# Patient Record
Sex: Female | Born: 1970 | Race: White | Hispanic: No | Smoking: Former smoker
Health system: Southern US, Community
[De-identification: ages and names within clinical notes are randomized; demographics above are authoritative.]

## PROBLEM LIST (undated history)

## (undated) ENCOUNTER — Ambulatory Visit

## (undated) ENCOUNTER — Encounter

## (undated) DIAGNOSIS — M5430 Sciatica, unspecified side: Secondary | ICD-10-CM

## (undated) DIAGNOSIS — T7840XA Allergy, unspecified, initial encounter: Secondary | ICD-10-CM

## (undated) DIAGNOSIS — G589 Mononeuropathy, unspecified: Secondary | ICD-10-CM

## (undated) DIAGNOSIS — G459 Transient cerebral ischemic attack, unspecified: Secondary | ICD-10-CM

## (undated) DIAGNOSIS — I341 Nonrheumatic mitral (valve) prolapse: Secondary | ICD-10-CM

## (undated) DIAGNOSIS — R011 Cardiac murmur, unspecified: Secondary | ICD-10-CM

## (undated) HISTORY — PX: HYSTEROSALPINGOGRAM: SHX6581

## (undated) HISTORY — DX: Cardiac murmur, unspecified: R01.1

## (undated) HISTORY — DX: Allergy, unspecified, initial encounter: T78.40XA

## (undated) HISTORY — PX: LAPAROSCOPY WITH CO2 LASER: SHX5937

---

## 2011-03-29 ENCOUNTER — Other Ambulatory Visit (HOSPITAL_COMMUNITY)
Admission: RE | Admit: 2011-03-29 | Discharge: 2011-03-29 | Disposition: A | Discharge status: Home / Self Care | Payer: Self-pay | Source: Ambulatory Visit | Attending: Internal Medicine | Admitting: Internal Medicine

## 2011-03-29 DIAGNOSIS — Z01419 Encounter for gynecological examination (general) (routine) without abnormal findings: Secondary | ICD-10-CM | POA: Insufficient documentation

## 2011-11-23 ENCOUNTER — Encounter (HOSPITAL_BASED_OUTPATIENT_CLINIC_OR_DEPARTMENT_OTHER): Payer: Self-pay | Admitting: *Deleted

## 2011-11-23 ENCOUNTER — Emergency Department (HOSPITAL_BASED_OUTPATIENT_CLINIC_OR_DEPARTMENT_OTHER): Payer: Self-pay

## 2011-11-23 ENCOUNTER — Emergency Department (HOSPITAL_BASED_OUTPATIENT_CLINIC_OR_DEPARTMENT_OTHER)
Admission: EM | Admit: 2011-11-23 | Discharge: 2011-11-23 | Disposition: A | Discharge status: Home / Self Care | Payer: Self-pay | Attending: Emergency Medicine | Admitting: Emergency Medicine

## 2011-11-23 DIAGNOSIS — S060XAA Concussion with loss of consciousness status unknown, initial encounter: Secondary | ICD-10-CM | POA: Insufficient documentation

## 2011-11-23 DIAGNOSIS — IMO0002 Reserved for concepts with insufficient information to code with codable children: Secondary | ICD-10-CM | POA: Insufficient documentation

## 2011-11-23 DIAGNOSIS — F17200 Nicotine dependence, unspecified, uncomplicated: Secondary | ICD-10-CM | POA: Insufficient documentation

## 2011-11-23 DIAGNOSIS — S060X9A Concussion with loss of consciousness of unspecified duration, initial encounter: Secondary | ICD-10-CM

## 2011-11-23 DIAGNOSIS — M546 Pain in thoracic spine: Secondary | ICD-10-CM | POA: Insufficient documentation

## 2011-11-23 DIAGNOSIS — M542 Cervicalgia: Secondary | ICD-10-CM | POA: Insufficient documentation

## 2011-11-23 DIAGNOSIS — Y92009 Unspecified place in unspecified non-institutional (private) residence as the place of occurrence of the external cause: Secondary | ICD-10-CM | POA: Insufficient documentation

## 2011-11-23 HISTORY — DX: Nonrheumatic mitral (valve) prolapse: I34.1

## 2011-11-23 MED ORDER — KETOROLAC TROMETHAMINE 60 MG/2ML IM SOLN
60.0000 mg | Freq: Once | INTRAMUSCULAR | Status: AC
  Administered 2011-11-23: 60 mg via INTRAMUSCULAR
  Filled 2011-11-23: qty 2

## 2011-11-23 NOTE — ED Notes (Signed)
C-Collar was removed by Dr Alto Denver after cleared by CT. No change in neuro status.
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
## 2011-11-23 NOTE — ED Notes (Signed)
Return from CT
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
## 2011-11-23 NOTE — ED Notes (Signed)
Patient transported to CT 

## 2011-11-23 NOTE — ED Notes (Signed)
Dr. Hunt at bedside.
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
## 2012-10-01 ENCOUNTER — Other Ambulatory Visit: Payer: Self-pay | Admitting: Family Medicine

## 2012-10-01 DIAGNOSIS — R748 Abnormal levels of other serum enzymes: Secondary | ICD-10-CM

## 2012-10-09 ENCOUNTER — Ambulatory Visit
Admission: RE | Admit: 2012-10-09 | Discharge: 2012-10-09 | Disposition: A | Discharge status: Home / Self Care | Payer: 59 | Source: Ambulatory Visit | Attending: Family Medicine | Admitting: Family Medicine

## 2012-10-09 DIAGNOSIS — R748 Abnormal levels of other serum enzymes: Secondary | ICD-10-CM

## 2013-08-01 ENCOUNTER — Emergency Department (HOSPITAL_COMMUNITY)
Admission: EM | Admit: 2013-08-01 | Discharge: 2013-08-01 | Disposition: A | Discharge status: Home / Self Care | Payer: BC Managed Care – PPO | Attending: Emergency Medicine | Admitting: Emergency Medicine

## 2013-08-01 ENCOUNTER — Encounter (HOSPITAL_COMMUNITY): Payer: Self-pay | Admitting: Emergency Medicine

## 2013-08-01 ENCOUNTER — Emergency Department (HOSPITAL_COMMUNITY): Payer: BC Managed Care – PPO

## 2013-08-01 DIAGNOSIS — R5381 Other malaise: Secondary | ICD-10-CM | POA: Insufficient documentation

## 2013-08-01 DIAGNOSIS — Z8679 Personal history of other diseases of the circulatory system: Secondary | ICD-10-CM | POA: Insufficient documentation

## 2013-08-01 DIAGNOSIS — R112 Nausea with vomiting, unspecified: Secondary | ICD-10-CM | POA: Insufficient documentation

## 2013-08-01 DIAGNOSIS — F17200 Nicotine dependence, unspecified, uncomplicated: Secondary | ICD-10-CM | POA: Insufficient documentation

## 2013-08-01 DIAGNOSIS — Z8673 Personal history of transient ischemic attack (TIA), and cerebral infarction without residual deficits: Secondary | ICD-10-CM | POA: Insufficient documentation

## 2013-08-01 DIAGNOSIS — Z79899 Other long term (current) drug therapy: Secondary | ICD-10-CM | POA: Insufficient documentation

## 2013-08-01 DIAGNOSIS — H538 Other visual disturbances: Secondary | ICD-10-CM | POA: Insufficient documentation

## 2013-08-01 DIAGNOSIS — R531 Weakness: Secondary | ICD-10-CM

## 2013-08-01 DIAGNOSIS — R5383 Other fatigue: Principal | ICD-10-CM

## 2013-08-01 DIAGNOSIS — Z8739 Personal history of other diseases of the musculoskeletal system and connective tissue: Secondary | ICD-10-CM | POA: Insufficient documentation

## 2013-08-01 DIAGNOSIS — R630 Anorexia: Secondary | ICD-10-CM | POA: Insufficient documentation

## 2013-08-01 DIAGNOSIS — F411 Generalized anxiety disorder: Secondary | ICD-10-CM | POA: Insufficient documentation

## 2013-08-01 HISTORY — DX: Mononeuropathy, unspecified: G58.9

## 2013-08-01 HISTORY — DX: Sciatica, unspecified side: M54.30

## 2013-08-01 HISTORY — DX: Transient cerebral ischemic attack, unspecified: G45.9

## 2013-08-01 LAB — BASIC METABOLIC PANEL
BUN: 6 mg/dL (ref 6–23)
CALCIUM: 9.9 mg/dL (ref 8.4–10.5)
CO2: 28 mEq/L (ref 19–32)
CREATININE: 0.41 mg/dL — AB (ref 0.50–1.10)
Chloride: 97 mEq/L (ref 96–112)
GFR calc non Af Amer: >90 mL/min (ref >90)
Glucose, Bld: 90 mg/dL (ref 70–99)
Potassium: 4.1 mEq/L (ref 3.7–5.3)
Sodium: 141 mEq/L (ref 137–147)

## 2013-08-01 LAB — CBC WITH DIFFERENTIAL/PLATELET
BASOS ABS: 0.1 10*3/uL (ref 0.0–0.1)
Basophils Relative: 3 % — ABNORMAL HIGH (ref 0–1)
Eosinophils Absolute: 0.1 10*3/uL (ref 0.0–0.7)
Eosinophils Relative: 2 % (ref 0–5)
HCT: 39.1 % (ref 36.0–46.0)
Hemoglobin: 13.8 g/dL (ref 12.0–15.0)
LYMPHS PCT: 36 % (ref 12–46)
Lymphs Abs: 1.6 10*3/uL (ref 0.7–4.0)
MCH: 34.6 pg — ABNORMAL HIGH (ref 26.0–34.0)
MCHC: 35.3 g/dL (ref 30.0–36.0)
MCV: 98 fL (ref 78.0–100.0)
Monocytes Absolute: 0.3 10*3/uL (ref 0.1–1.0)
Monocytes Relative: 6 % (ref 3–12)
NEUTROS ABS: 2.3 10*3/uL (ref 1.7–7.7)
Neutrophils Relative %: 53 % (ref 43–77)
PLATELETS: 114 10*3/uL — AB (ref 150–400)
RBC: 3.99 MIL/uL (ref 3.87–5.11)
RDW: 12.3 % (ref 11.5–15.5)
WBC: 4.3 10*3/uL (ref 4.0–10.5)

## 2013-08-01 MED ORDER — IOHEXOL 350 MG/ML SOLN
100.0000 mL | Freq: Once | INTRAVENOUS | Status: AC | PRN
  Administered 2013-08-01: 100 mL via INTRAVENOUS

## 2013-08-01 MED ORDER — LORAZEPAM 2 MG/ML IJ SOLN
0.5000 mg | Freq: Once | INTRAMUSCULAR | Status: AC
  Administered 2013-08-01: 0.5 mg via INTRAVENOUS
  Filled 2013-08-01: qty 1
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
## 2013-08-01 NOTE — Discharge Instructions (Signed)
Take aspirin daily until you see neurology.  If you were given medicines take as directed.  If you are on coumadin or contraceptives realize their levels and effectiveness is altered by many different medicines.  If you have any reaction (rash, tongues swelling, other) to the medicines stop taking and see a physician.   Please follow up as directed and return to the ER or see a physician for new or worsening symptoms.  Thank you. Filed Vitals:   08/01/13 1541 08/01/13 1937  BP: 119/90 103/76  Pulse: 85 80  Temp: 98.1 F (36.7 C)   TempSrc: Oral   Resp: 17 16  SpO2: 100% 98%

## 2013-08-01 NOTE — ED Notes (Signed)
Pt BIB EMS. Pt c/o L side numbness for 4 days. Pt states she was diagnosed with TIAs over the past 3 weeks. Pt was at work today and collapsed on the floor due to her weakness. Pt has negative stroke screen upon exam by EMS. Pt states now her "whole left side" hurts. States she also has a hx of sciatica and "pinched nerve". Pt spitting up in triage. States she is not nauseous, but has mitral valve prolapse and it makes her short of breath and spit up.

## 2014-01-04 ENCOUNTER — Emergency Department (HOSPITAL_COMMUNITY): Payer: BC Managed Care – PPO

## 2014-01-04 ENCOUNTER — Emergency Department (HOSPITAL_COMMUNITY)
Admission: EM | Admit: 2014-01-04 | Discharge: 2014-01-04 | Disposition: A | Discharge status: Home / Self Care | Payer: BC Managed Care – PPO | Attending: Emergency Medicine | Admitting: Emergency Medicine

## 2014-01-04 ENCOUNTER — Encounter (HOSPITAL_COMMUNITY): Payer: Self-pay | Admitting: Emergency Medicine

## 2014-01-04 DIAGNOSIS — IMO0002 Reserved for concepts with insufficient information to code with codable children: Secondary | ICD-10-CM | POA: Insufficient documentation

## 2014-01-04 DIAGNOSIS — L408 Other psoriasis: Secondary | ICD-10-CM | POA: Diagnosis not present

## 2014-01-04 DIAGNOSIS — Z3202 Encounter for pregnancy test, result negative: Secondary | ICD-10-CM | POA: Insufficient documentation

## 2014-01-04 DIAGNOSIS — F17200 Nicotine dependence, unspecified, uncomplicated: Secondary | ICD-10-CM | POA: Insufficient documentation

## 2014-01-04 DIAGNOSIS — Z8669 Personal history of other diseases of the nervous system and sense organs: Secondary | ICD-10-CM | POA: Insufficient documentation

## 2014-01-04 DIAGNOSIS — Z8673 Personal history of transient ischemic attack (TIA), and cerebral infarction without residual deficits: Secondary | ICD-10-CM | POA: Diagnosis not present

## 2014-01-04 DIAGNOSIS — S8010XA Contusion of unspecified lower leg, initial encounter: Secondary | ICD-10-CM | POA: Insufficient documentation

## 2014-01-04 DIAGNOSIS — S298XXA Other specified injuries of thorax, initial encounter: Secondary | ICD-10-CM | POA: Diagnosis not present

## 2014-01-04 DIAGNOSIS — S01501A Unspecified open wound of lip, initial encounter: Secondary | ICD-10-CM | POA: Insufficient documentation

## 2014-01-04 DIAGNOSIS — S81811A Laceration without foreign body, right lower leg, initial encounter: Secondary | ICD-10-CM

## 2014-01-04 DIAGNOSIS — S0990XA Unspecified injury of head, initial encounter: Secondary | ICD-10-CM | POA: Insufficient documentation

## 2014-01-04 DIAGNOSIS — S71109A Unspecified open wound, unspecified thigh, initial encounter: Secondary | ICD-10-CM | POA: Insufficient documentation

## 2014-01-04 DIAGNOSIS — T148XXA Other injury of unspecified body region, initial encounter: Secondary | ICD-10-CM

## 2014-01-04 DIAGNOSIS — S71009A Unspecified open wound, unspecified hip, initial encounter: Secondary | ICD-10-CM | POA: Diagnosis not present

## 2014-01-04 DIAGNOSIS — S8011XA Contusion of right lower leg, initial encounter: Secondary | ICD-10-CM

## 2014-01-04 DIAGNOSIS — Z79899 Other long term (current) drug therapy: Secondary | ICD-10-CM | POA: Insufficient documentation

## 2014-01-04 DIAGNOSIS — Z8679 Personal history of other diseases of the circulatory system: Secondary | ICD-10-CM | POA: Insufficient documentation

## 2014-01-04 DIAGNOSIS — Y92410 Unspecified street and highway as the place of occurrence of the external cause: Secondary | ICD-10-CM | POA: Diagnosis not present

## 2014-01-04 DIAGNOSIS — Y9389 Activity, other specified: Secondary | ICD-10-CM | POA: Diagnosis not present

## 2014-01-04 DIAGNOSIS — M5430 Sciatica, unspecified side: Secondary | ICD-10-CM | POA: Insufficient documentation

## 2014-01-04 LAB — I-STAT CHEM 8, ED
BUN: 8 mg/dL (ref 6–23)
Calcium, Ion: 0.91 mmol/L — ABNORMAL LOW (ref 1.12–1.23)
Chloride: 106 mEq/L (ref 96–112)
Creatinine, Ser: 1 mg/dL (ref 0.50–1.10)
GLUCOSE: 81 mg/dL (ref 70–99)
HEMATOCRIT: 37 % (ref 36.0–46.0)
HEMOGLOBIN: 12.6 g/dL (ref 12.0–15.0)
POTASSIUM: 3.9 meq/L (ref 3.7–5.3)
SODIUM: 140 meq/L (ref 137–147)
TCO2: 20 mmol/L (ref 0–100)

## 2014-01-04 LAB — CBG MONITORING, ED: Glucose-Capillary: 71 mg/dL (ref 70–99)

## 2014-01-04 LAB — POC URINE PREG, ED: Preg Test, Ur: NEGATIVE

## 2014-01-04 MED ORDER — SODIUM CHLORIDE 0.9 % IV BOLUS (SEPSIS)
1000.0000 mL | Freq: Once | INTRAVENOUS | Status: AC
  Administered 2014-01-04: 1000 mL via INTRAVENOUS

## 2014-01-04 MED ORDER — METHOCARBAMOL 500 MG PO TABS: 1000.0000 mg | ORAL_TABLET | Freq: Four times a day (QID) | ORAL | Status: DC

## 2014-01-04 MED ORDER — SODIUM CHLORIDE 0.9 % IV BOLUS (SEPSIS): 1000.0000 mL | Freq: Once | INTRAVENOUS | Status: DC

## 2014-01-04 MED ORDER — LIDOCAINE-EPINEPHRINE (PF) 2 %-1:200000 IJ SOLN
20.0000 mL | Freq: Once | INTRAMUSCULAR | Status: AC
  Administered 2014-01-04: 20 mL via INTRADERMAL
  Filled 2014-01-04: qty 10

## 2014-01-04 MED ORDER — IOHEXOL 300 MG/ML  SOLN
100.0000 mL | Freq: Once | INTRAMUSCULAR | Status: AC | PRN
  Administered 2014-01-04: 100 mL via INTRAVENOUS

## 2014-01-04 NOTE — ED Notes (Signed)
Pt got up to use the restroom, felt faint and had a brief syncopal episode. States that she just felt hot and nauseated before she syncopized. Mother lowered patient to the floor. Pt did urinate on herself. PA geiple notified.

## 2014-01-04 NOTE — ED Notes (Signed)
Pt ambulated to bathroom with assistance.
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
## 2014-01-04 NOTE — ED Notes (Signed)
Unable to get a BP and pulse on pt while standing. When pt stood up she became weak and started to fall.

## 2014-01-04 NOTE — ED Notes (Signed)
Pt is aware that we need a urine sample. Attempted to draw blood. Blood clotted off.

## 2014-01-05 NOTE — ED Provider Notes (Signed)
Medical screening examination/treatment/procedure(s) were conducted as a shared visit with non-physician practitioner(s) and myself.  I personally evaluated the patient during the encounter.   EKG Interpretation None       Adrian Dinovo, MD 01/05/14 0032 

## 2014-02-24 ENCOUNTER — Other Ambulatory Visit (HOSPITAL_COMMUNITY)
Admission: RE | Admit: 2014-02-24 | Discharge: 2014-02-24 | Disposition: A | Discharge status: Home / Self Care | Payer: BC Managed Care – PPO | Source: Ambulatory Visit | Attending: Internal Medicine | Admitting: Internal Medicine

## 2014-02-24 DIAGNOSIS — Z01411 Encounter for gynecological examination (general) (routine) with abnormal findings: Secondary | ICD-10-CM | POA: Insufficient documentation

## 2014-02-24 DIAGNOSIS — Z1151 Encounter for screening for human papillomavirus (HPV): Secondary | ICD-10-CM | POA: Insufficient documentation

## 2015-08-13 DIAGNOSIS — S8982XA Other specified injuries of left lower leg, initial encounter: Secondary | ICD-10-CM | POA: Diagnosis not present

## 2015-08-13 DIAGNOSIS — S098XXA Other specified injuries of head, initial encounter: Secondary | ICD-10-CM | POA: Diagnosis not present

## 2015-09-01 DIAGNOSIS — N390 Urinary tract infection, site not specified: Secondary | ICD-10-CM | POA: Diagnosis not present

## 2015-12-02 DIAGNOSIS — R10819 Abdominal tenderness, unspecified site: Secondary | ICD-10-CM | POA: Diagnosis not present

## 2016-06-07 DIAGNOSIS — N390 Urinary tract infection, site not specified: Secondary | ICD-10-CM | POA: Diagnosis not present

## 2016-06-18 ENCOUNTER — Emergency Department (HOSPITAL_COMMUNITY): Payer: BLUE CROSS/BLUE SHIELD

## 2016-06-18 ENCOUNTER — Encounter (HOSPITAL_COMMUNITY): Payer: Self-pay | Admitting: Emergency Medicine

## 2016-06-18 ENCOUNTER — Emergency Department (HOSPITAL_COMMUNITY)
Admission: EM | Admit: 2016-06-18 | Discharge: 2016-06-18 | Disposition: A | Discharge status: Home / Self Care | Payer: BLUE CROSS/BLUE SHIELD | Attending: Emergency Medicine | Admitting: Emergency Medicine

## 2016-06-18 DIAGNOSIS — Z8673 Personal history of transient ischemic attack (TIA), and cerebral infarction without residual deficits: Secondary | ICD-10-CM | POA: Diagnosis not present

## 2016-06-18 DIAGNOSIS — G43109 Migraine with aura, not intractable, without status migrainosus: Secondary | ICD-10-CM | POA: Diagnosis not present

## 2016-06-18 DIAGNOSIS — Y908 Blood alcohol level of 240 mg/100 ml or more: Secondary | ICD-10-CM | POA: Diagnosis not present

## 2016-06-18 DIAGNOSIS — Z5181 Encounter for therapeutic drug level monitoring: Secondary | ICD-10-CM | POA: Insufficient documentation

## 2016-06-18 DIAGNOSIS — F17200 Nicotine dependence, unspecified, uncomplicated: Secondary | ICD-10-CM | POA: Insufficient documentation

## 2016-06-18 DIAGNOSIS — R202 Paresthesia of skin: Secondary | ICD-10-CM | POA: Insufficient documentation

## 2016-06-18 DIAGNOSIS — F10920 Alcohol use, unspecified with intoxication, uncomplicated: Secondary | ICD-10-CM

## 2016-06-18 DIAGNOSIS — F419 Anxiety disorder, unspecified: Secondary | ICD-10-CM | POA: Diagnosis not present

## 2016-06-18 DIAGNOSIS — F10220 Alcohol dependence with intoxication, uncomplicated: Secondary | ICD-10-CM | POA: Diagnosis not present

## 2016-06-18 DIAGNOSIS — F10120 Alcohol abuse with intoxication, uncomplicated: Secondary | ICD-10-CM | POA: Diagnosis not present

## 2016-06-18 DIAGNOSIS — R51 Headache: Secondary | ICD-10-CM | POA: Diagnosis not present

## 2016-06-18 LAB — COMPREHENSIVE METABOLIC PANEL
ALK PHOS: 57 U/L (ref 38–126)
ALT: 98 U/L — AB (ref 14–54)
AST: 151 U/L — ABNORMAL HIGH (ref 15–41)
Albumin: 4.4 g/dL (ref 3.5–5.0)
Anion gap: 15 (ref 5–15)
BILIRUBIN TOTAL: 0.7 mg/dL (ref 0.3–1.2)
BUN: 9 mg/dL (ref 6–20)
CALCIUM: 8.9 mg/dL (ref 8.9–10.3)
CO2: 22 mmol/L (ref 22–32)
CREATININE: 0.48 mg/dL (ref 0.44–1.00)
Chloride: 103 mmol/L (ref 101–111)
Glucose, Bld: 120 mg/dL — ABNORMAL HIGH (ref 65–99)
Potassium: 3.9 mmol/L (ref 3.5–5.1)
Sodium: 140 mmol/L (ref 135–145)
TOTAL PROTEIN: 7.5 g/dL (ref 6.5–8.1)

## 2016-06-18 LAB — DIFFERENTIAL
BASOS PCT: 2 %
Basophils Absolute: 0.1 10*3/uL (ref 0.0–0.1)
EOS ABS: 0.2 10*3/uL (ref 0.0–0.7)
EOS PCT: 4 %
LYMPHS PCT: 39 %
Lymphs Abs: 1.5 10*3/uL (ref 0.7–4.0)
Monocytes Absolute: 0.4 10*3/uL (ref 0.1–1.0)
Monocytes Relative: 9 %
NEUTROS ABS: 1.7 10*3/uL (ref 1.7–7.7)
Neutrophils Relative %: 46 %

## 2016-06-18 LAB — CBG MONITORING, ED: Glucose-Capillary: 113 mg/dL — ABNORMAL HIGH (ref 65–99)

## 2016-06-18 LAB — CBC
HEMATOCRIT: 39.2 % (ref 36.0–46.0)
Hemoglobin: 13.2 g/dL (ref 12.0–15.0)
MCH: 34.6 pg — ABNORMAL HIGH (ref 26.0–34.0)
MCHC: 33.7 g/dL (ref 30.0–36.0)
MCV: 102.9 fL — AB (ref 78.0–100.0)
PLATELETS: 90 10*3/uL — AB (ref 150–400)
RBC: 3.81 MIL/uL — ABNORMAL LOW (ref 3.87–5.11)
RDW: 12.1 % (ref 11.5–15.5)
WBC: 3.9 10*3/uL — ABNORMAL LOW (ref 4.0–10.5)

## 2016-06-18 LAB — I-STAT CHEM 8, ED
BUN: 11 mg/dL (ref 6–20)
Calcium, Ion: 0.98 mmol/L — ABNORMAL LOW (ref 1.15–1.40)
Chloride: 104 mmol/L (ref 101–111)
Creatinine, Ser: 0.9 mg/dL (ref 0.44–1.00)
GLUCOSE: 117 mg/dL — AB (ref 65–99)
HCT: 38 % (ref 36.0–46.0)
Hemoglobin: 12.9 g/dL (ref 12.0–15.0)
Potassium: 4.1 mmol/L (ref 3.5–5.1)
SODIUM: 141 mmol/L (ref 135–145)
TCO2: 24 mmol/L (ref 0–100)

## 2016-06-18 LAB — PROTIME-INR
INR: 1.01
PROTHROMBIN TIME: 13.3 s (ref 11.4–15.2)

## 2016-06-18 LAB — I-STAT TROPONIN, ED: TROPONIN I, POC: 0 ng/mL (ref 0.00–0.08)

## 2016-06-18 LAB — URINALYSIS, ROUTINE W REFLEX MICROSCOPIC
Bilirubin Urine: NEGATIVE
Glucose, UA: NEGATIVE mg/dL
Hgb urine dipstick: NEGATIVE
Ketones, ur: NEGATIVE mg/dL
LEUKOCYTES UA: NEGATIVE
NITRITE: NEGATIVE
PROTEIN: NEGATIVE mg/dL
SPECIFIC GRAVITY, URINE: 1.005 (ref 1.005–1.030)
pH: 5 (ref 5.0–8.0)

## 2016-06-18 LAB — RAPID URINE DRUG SCREEN, HOSP PERFORMED
Amphetamines: NOT DETECTED
BARBITURATES: NOT DETECTED
Benzodiazepines: NOT DETECTED
Cocaine: NOT DETECTED
Opiates: NOT DETECTED
Tetrahydrocannabinol: NOT DETECTED

## 2016-06-18 LAB — ETHANOL
ALCOHOL ETHYL (B): 367 mg/dL — AB (ref <5)
Alcohol, Ethyl (B): 365 mg/dL (ref <5)

## 2016-06-18 LAB — APTT: aPTT: 30 seconds (ref 24–36)

## 2016-06-18 MED ORDER — METOCLOPRAMIDE HCL 5 MG/ML IJ SOLN: 10.0000 mg | Freq: Once | INTRAMUSCULAR | Status: DC

## 2016-06-18 MED ORDER — DEXAMETHASONE SODIUM PHOSPHATE 10 MG/ML IJ SOLN: 10.0000 mg | Freq: Once | INTRAMUSCULAR | Status: DC

## 2016-06-18 MED ORDER — DIPHENHYDRAMINE HCL 50 MG/ML IJ SOLN: 25.0000 mg | Freq: Once | INTRAMUSCULAR | Status: DC

## 2016-06-18 NOTE — Code Documentation (Signed)
46 y.o. Female presents to [facility] via [facility] as code stroke. Pt was at work today in her usual state of health when around 1030 she had an acute onset of left sided numbness accompanied by a H/A. The pt states last night around dinner she had similar left sided numbness which completley resolved. On arrival to [facility] ED, labs were drawn and pt taken to CT. CTH showing stable and negative noncontrast CT appearance of the brain. ASPECTS is 10. NIHSS 6. See EMR for code stroke times and NIHSS. On exam pt with H/A, left sided sensory loss, drift in her LUE and LLE, and difficulty with speach. tPA not given d/t stroke not suspected. MRI pending. Pt to be discharged if MRI negative and seek outpatient follow up, per neurologist. Bedside handoff with ED RN Shanda Bumps

## 2016-06-18 NOTE — Consult Note (Signed)
Code Stroke Consultation Note  Consult Requested by: Dr. Judd Lienelo  Reason for Consult: code stroke  Consult Date: 06/18/16  The history was obtained from the pt and fiance and father.  During history and examination, all items were able to obtain unless otherwise noted.  History of Present Illness:  Sara PereyraLori Bluett is a 46 y.o. with Caucasian female with PMH of anxiety, MVP and smoking presented with code stroke. As per pt, for the last 3 days, she had left sided headache after work, photophobia and phonophobia, had to lie down in dark room with silence. Last night, she started to have left side headache with left eye pain and left face and left arm numbness lasted about a couple of hours. This morning she woke up normal, went to work and at 10:30am stated to have left sided headache and left face and arm numbness again. EMS called, BP 115/70 and glucose 113. Code stroke called.   On ER arrival, pt has photophobia and phonophobia, but stuttering speech, give away weakness on the left with positive hoover's sign. CT head negative.   Fiance and father arrived later. Detailed hx taken, pt high anxiety at home and at work. Refused medication in the past. She had several episodes in the past, sounds like panic attack during which she first collapsed and disorientated, but then hid into small space, frightened and did not recognized family members, and gradually recovered. 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 CT and CTA head negative at that time.   She is current smoker, denies heavy alcohol or illicit drugs. 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   LSN: 10:30am today tPA Given: No: not consistent with stroke   Past Medical History:  Diagnosis Date  . Mitral valve prolapse   . Pinched nerve   . Sciatica   . TIA (transient ischemic attack)      History reviewed. No pertinent surgical history.  History reviewed. No pertinent family history.  Social History:  reports that she has been smoking.  She does not have any smokeless tobacco history on file. She reports that she drinks alcohol. She reports that she does not use drugs.  Allergies:  Allergies  Allergen Reactions  . Sulfa Antibiotics Anaphylaxis    No current facility-administered medications on file prior to encounter.    Current Outpatient Prescriptions on File Prior to Encounter  Medication Sig Dispense Refill  . acidophilus (RISAQUAD) CAPS capsule Take 1 capsule by mouth daily.    Marland Kitchen. BIOTIN PO Take 1 tablet by mouth daily.    . calcipotriene-betamethasone (TACLONEX) external suspension Apply 1 application topically daily. Head to toe    . methocarbamol (ROBAXIN) 500 MG tablet Take 2 tablets (1,000 mg total) by mouth 4 (four) times daily. 20 tablet 0  . Multiple Vitamin (MULTIVITAMIN WITH MINERALS) TABS tablet Take 1 tablet by mouth daily.    . sertraline (ZOLOFT) 50 MG tablet Take 50 mg by mouth daily.      Review of Systems: A full ROS was attempted today and was able to be performed.  Systems assessed include - Constitutional, Eyes, HENT, Respiratory, Cardiovascular, Gastrointestinal, Genitourinary, Integument/breast, Hematologic/lymphatic, Musculoskeletal, Neurological, Behavioral/Psych, Endocrine,  Allergic/Immunologic - with pertinent responses as per HPI.  Physical Examination: Temp:  [97.6 F (36.4 C)-98 F (36.7 C)] 97.6 F (36.4 C) (02/26 1155) Pulse Rate:  [82]  82 (02/26 1155) Resp:  [14] 14 (02/26 1155) BP: (115)/(77) 115/77 (02/26 1155) SpO2:  [98 %] 98 % (02/26 1155) Weight:  [182 lb 12.2 oz (82.9 kg)] 182 lb 12.2 oz (82.9 kg) (02/26 1100)  General - The patient's general appearance was well nourished, well developed, in no apparent distress.    Ophthalmologic - fundi not visualized due to eye movement.    Cardiovascular - Ausculation of  the heart revealed regular rate and rhythm.  Mental Status -  Level of arousal and orientation to time, place, and person were intact. Language including expression, naming, repetition, comprehension, reading, and writing was assessed and found intact, intermittent stuttering speech, but normal speech with distraction.  Fund of Knowledge was assessed and was intact.  Cranial Nerves II - XII - II - Vision intact OU. III, IV, VI - Extraocular movements intact. V - Facial sensation was subjectively decreased on the left. VII - Facial movement intact bilaterally. VIII - Hearing & vestibular intact bilaterally. X - Palate elevates symmetrically. XI - Chin turning & shoulder shrug intact bilaterally. XII - Tongue protrusion intact.  Motor Strength - The patient's strength was normal in RUE and RLE, however, significant left UE and LE giveaway weakness. Left UE drift but no pronation which can be aborted with distraction. Left LE positive hoover sign.    Motor Tone & Bulk - Muscle tone was assessed at the neck and appendages and was normal.  Bulk was normal and fasciculations were absent.   Reflexes - The patient's reflexes were normal in all extremities and she had no pathological reflexes.  Sensory - Light touch, temperature/pinprick were assessed and were subjectively decreased on the left.    Coordination - The patient had normal movements in the handswith no ataxia or dysmetria, however slow motion on the left.  Tremor was absent.  Gait and Station - deferred.  NIH Stroke Scale  Level Of Consciousness 0=Alert; keenly responsive 1=Not alert, but arousable by minor stimulation 2=Not alert, requires repeated stimulation 3=Responds only with reflex movements 0  LOC Questions to Month and Age 36=Answers both questions correctly 1=Answers one question correctly 2=Answers neither question correctly 0  LOC Commands      -Open/Close eyes     -Open/close grip 0=Performs both tasks  correctly 1=Performs one task correctly 2=Performs neighter task correctly 0  Best Gaze 0=Normal 1=Partial gaze palsy 2=Forced deviation, or total gaze paresis 0  Visual 0=No visual loss 1=Partial hemianopia 2=Complete hemianopia 3=Bilateral hemianopia (blind including cortical blindness) 0  Facial Palsy 0=Normal symmetrical movement 1=Minor paralysis (asymmetry) 2=Partial paralysis (lower face) 3=Complete paralysis (upper and lower face) 0  Motor  0=No drift, limb holds posture for full 10 seconds 1=Drift, limb holds posture, no drift to bed 2=Some antigravity effort, cannot maintain posture, drifts to bed 3=No effort against gravity, limb falls 4=No movement Right Arm 0     Leg 0    Left Arm 1     Leg 1  Limb Ataxia 0=Absent 1=Present in one limb 2=Present in two limbs 0  Sensory 0=Normal 1=Mild to moderate sensory loss 2=Severe to total sensory loss 1  Best Language 0=No aphasia, normal 1=Mild to moderate aphasia 2=Mute, global aphasia 3=Mute, global aphasia 0  Dysarthria 0=Normal 1=Mild to moderate 2=Severe, unintelligible or mute/anarthric 0  Extinction/Neglect 0=No abnormality 1=Extinction to bilateral simultaneous stimulation 2=Profound neglect 0  Total   3     Data Reviewed: I have personally reviewed the radiological images below and agree with the radiology  interpretations.  Ct Head Code Stroke W/o Cm 06/18/2016 IMPRESSION: 1. Stable and negative noncontrast CT appearance of the brain. 2. ASPECTS is 10.    Assessment: 46 y.o. female with PMH of anxiety, MVP and smoking presented with code stroke. She had HA with photophobia and phonophobia for the last 2-3 days, but had left sided numbness last night and this morning. Pt has significant hx of anxiety and panic attacks. 2015 one similar episode and CT, CTA head negative. Neuro exam suggesting functional component. Decided no tPA due to s/s not consistent with stroke. The presentation most likely complicated  migraine given HA, photo and phonophobia, vs. Anxiety given hx and functional exam. CT head negative. Will do MRI to rule out acute stroke.    If MRI showed no acute stroke, pt may be discharged from ED if she is feeling better and able to ambulate by herself. Otherwise, recommend to admit for observation. Agree with tylenol, ibuprofen or migraine cocktail for HA management.   Plan: - MR brain without contrast to rule out stroke - HA management per ED - neuro checks - if MRI negative for acute abnormalities, and pt improves and able to ambulate by herself, she can be discharged and follow up as outpt. Otherwise, recommend admission for observation.  Thank you for this consultation and allowing [place] to participate in the care of this patient.  Marvel Plan, MD PhD Stroke Neurology 06/18/2016 12:42 PM  This patient is critically ill due to code stroke status. This patient's care requires constant monitoring of vital signs, hemodynamics, respiratory and cardiac monitoring, review of multiple databases, neurological assessment, discussion with family, other specialists and medical decision making of high complexity. I spent 50 minutes of neurocritical care time in the care of this patient. I had long discussion with pt, fiance, and father at bedside regarding current diagnosis, differential diagnosis, treatment options, and further MRI testing.

## 2016-06-18 NOTE — ED Notes (Signed)
Patient Alert and oriented X4. Stable and ambulatory. Patient verbalized understanding of the discharge instructions.  Patient belongings were taken by the patient. Patient is having FRIEND drive her home

## 2016-06-18 NOTE — Discharge Instructions (Signed)
Follow-up with your primary Dr. in the next week, and return to the emergency department if your symptoms significantly worsen or change.

## 2016-06-18 NOTE — ED Notes (Signed)
Patient transported to MRI 

## 2016-06-18 NOTE — ED Provider Notes (Signed)
[facility]-EMERGENCY DEPT Provider Note   CSN: [id number] Arrival date & time: 06/18/16  1113   An emergency department physician performed an initial assessment on this suspected stroke patient at 7.  History   Chief Complaint Chief Complaint  Patient presents with  . Code Stroke    HPI Sara Collins is a 46 y.o. female.  Patient is a 46 year old female with history of mitral valve prolapse, sciatica. She presents today for evaluation of right-sided headache and left arm and leg numbness that started this morning while at work. This began in the absence of any injury or trauma. She denies any visual disturbances, nausea, or weakness. She denies any recent stressful events or increased stress at work or home. She was brought here by EMS. Due to the nature and timing of her symptoms, a code stroke was initiated.   The history is provided by the patient.  Weakness  Primary symptoms include focal weakness. This is a new problem. The problem has not changed since onset.There was no focality noted. There has been no fever. Associated symptoms include headaches. Pertinent negatives include no shortness of breath and no chest pain.    Past Medical History:  Diagnosis Date  . Mitral valve prolapse   . Pinched nerve   . Sciatica   . TIA (transient ischemic attack)     There are no active problems to display for this patient.   History reviewed. No pertinent surgical history.  OB History    No data available       Home Medications    Prior to Admission medications   Medication Sig Start Date End Date Taking? Authorizing Provider  acidophilus (RISAQUAD) CAPS capsule Take 1 capsule by mouth daily.    Historical Provider, MD  BIOTIN PO Take 1 tablet by mouth daily.    Historical Provider, MD  calcipotriene-betamethasone [facility]) external suspension Apply 1 application topically daily. Head to toe    Historical Provider, MD  methocarbamol (ROBAXIN) 500 MG tablet Take 2 tablets  (1,000 mg total) by mouth 4 (four) times daily. 01/04/14   Renne Crigler, PA-C  Multiple Vitamin (MULTIVITAMIN WITH MINERALS) TABS tablet Take 1 tablet by mouth daily.    Historical Provider, MD  sertraline (ZOLOFT) 50 MG tablet Take 50 mg by mouth daily.    Historical Provider, MD    Family History History reviewed. No pertinent family history.  Social History Social History  Substance Use Topics  . Smoking status: Current Every Day Smoker  . Smokeless tobacco: Not on file  . Alcohol use Yes     Allergies   Sulfa antibiotics   Review of Systems Review of Systems  Respiratory: Negative for shortness of breath.   Cardiovascular: Negative for chest pain.  Neurological: Positive for focal weakness, weakness and headaches.  All other systems reviewed and are negative.    Physical Exam Updated Vital Signs BP 102/68   Pulse 68   Temp 98 F (36.7 C)   Resp 18   Ht 5' 6.93" (1.7 m)   Wt 182 lb 12.2 oz (82.9 kg)   SpO2 98%   BMI 28.69 kg/m   Physical Exam  Constitutional: She is oriented to person, place, and time. She appears well-developed and well-nourished. No distress.  HENT:  Head: Normocephalic and atraumatic.  Mouth/Throat: Oropharynx is clear and moist.  Eyes: EOM are normal. Pupils are equal, round, and reactive to light.  Neck: Normal range of motion. Neck supple.  Cardiovascular: Normal rate and regular rhythm.  Exam reveals no gallop and no friction rub.   No murmur heard. Pulmonary/Chest: Effort normal and breath sounds normal. No respiratory distress. She has no wheezes.  Abdominal: Soft. Bowel sounds are normal. She exhibits no distension. There is no tenderness.  Musculoskeletal: Normal range of motion. She exhibits no edema.  Neurological: She is alert and oriented to person, place, and time. No cranial nerve deficit. She exhibits normal muscle tone. Coordination normal.  Skin: Skin is warm and dry. She is not diaphoretic.  Nursing note and vitals  reviewed.    ED Treatments / Results  Labs (all labs ordered are listed, but only abnormal results are displayed) Labs Reviewed  ETHANOL - Abnormal; Notable for the following:       Result Value   Alcohol, Ethyl (B) 365 (*)    All other components within normal limits  CBC - Abnormal; Notable for the following:    WBC 3.9 (*)    RBC 3.81 (*)    MCV 102.9 (*)    MCH 34.6 (*)    Platelets 90 (*)    All other components within normal limits  COMPREHENSIVE METABOLIC PANEL - Abnormal; Notable for the following:    Glucose, Bld 120 (*)    AST 151 (*)    ALT 98 (*)    All other components within normal limits  URINALYSIS, ROUTINE W REFLEX MICROSCOPIC - Abnormal; Notable for the following:    Color, Urine STRAW (*)    All other components within normal limits  ETHANOL - Abnormal; Notable for the following:    Alcohol, Ethyl (B) 367 (*)    All other components within normal limits  I-STAT CHEM 8, ED - Abnormal; Notable for the following:    Glucose, Bld 117 (*)    Calcium, Ion 0.98 (*)    All other components within normal limits  CBG MONITORING, ED - Abnormal; Notable for the following:    Glucose-Capillary 113 (*)    All other components within normal limits  PROTIME-INR  APTT  DIFFERENTIAL  RAPID URINE DRUG SCREEN, HOSP PERFORMED  I-STAT TROPOININ, ED    EKG  EKG Interpretation  Date/Time:  Monday June 18 2016 11:42:47 EST Ventricular Rate:  84 PR Interval:    QRS Duration: 89 QT Interval:  406 QTC Calculation: 480 R Axis:   77 Text Interpretation:  Sinus rhythm Normal ECG Confirmed by Lillyona Polasek  MD, [place] ([id number]) on 06/18/2016 12:41:39 PM       Radiology Mr Brain Wo Contrast  Result Date: 06/18/2016 CLINICAL DATA:  Headache with left-sided weakness and numbness. EXAM: MRI HEAD WITHOUT CONTRAST TECHNIQUE: Multiplanar, multiecho pulse sequences of the brain and surrounding structures were obtained without intravenous contrast. COMPARISON:  06/18/2016 FINDINGS:  Brain: No acute or remote infarction, hemorrhage, hydrocephalus, extra-axial collection or mass lesion. No white matter disease or atrophy Vascular: Normal flow voids. Skull and upper cervical spine: Normal marrow signal. Sinuses/Orbits: Negative. IMPRESSION: Normal brain MRI. Electronically Signed   By: Marnee SpringJonathon  Watts M.D.   On: 06/18/2016 15:22   Ct Head Code Stroke W/o Cm  Result Date: 06/18/2016 CLINICAL DATA:  Code stroke. 46 year old female last seen normal at 10 30 hours. Left side weakness and headache. Initial encounter. EXAM: CT HEAD WITHOUT CONTRAST TECHNIQUE: Contiguous axial images were obtained from the base of the skull through the vertex without intravenous contrast. COMPARISON:  Head CT without contrast 01/04/2014 and earlier. FINDINGS: Brain: No midline shift, ventriculomegaly, mass effect, evidence of mass lesion, intracranial hemorrhage or  evidence of cortically based acute infarction. Gray-white matter differentiation is within normal limits throughout the brain. Vascular: No suspicious intracranial vascular hyperdensity. Skull: No osseous abnormality identified. Sinuses/Orbits: Stable in clear aside from mild chronic posterior right maxillary sinus mucosal thickening. Other: Visualized orbits and scalp soft tissues are within normal limits. ASPECTS (Alberta Stroke Program Early CT Score) - Ganglionic level infarction (caudate, lentiform nuclei, internal capsule, insula, M1-M3 cortex): 7 - Supraganglionic infarction (M4-M6 cortex): 3 Total score (0-10 with 10 being normal): 10 IMPRESSION: 1. Stable and negative noncontrast CT appearance of the brain. 2. ASPECTS is 10. 3. The above was relayed via text pager to Dr. Jerel Shepherd On 06/18/2016 at 1133 hours. Electronically Signed   By: Odessa Fleming M.D.   On: 06/18/2016 11:33    Procedures Procedures (including critical care time)  Medications Ordered in ED Medications - No data to display   Initial Impression / Assessment and Plan / ED Course    I have reviewed the triage vital signs and the nursing notes.  Pertinent labs & imaging results that were available during my care of the patient were reviewed by me and considered in my medical decision making (see chart for details).  Patient arrived here as a code stroke, however her physical examination reveals no focal deficits. Her symptoms are mainly subjective. She did have some slurred speech when repeating words, however neuro exam is otherwise nonfocal.  She underwent an emergent head CT which was reported normal. Laboratory studies were performed and were all unremarkable with the exception of a blood alcohol at 365.  She then underwent an MRI of the brain which was also negative, revealing no signs of stroke.  I asked the patient about her drinking habits. 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 To ensure that the lab value was indeed accurate, a second tube of blood was tested and this revealed a result of 367.  The patient appears to be somewhat dishonest with me when it comes to her alcohol consumption and also did not want family members in the room when we discussed the results of her tests. She has been ambulatory in the emergency department and her physical examination remains nonfocal upon reassessment. She will be discharged, to follow-up with her primary doctor. She is declining any alcohol treatment information.  Final Clinical Impressions(s) / ED Diagnoses   Final diagnoses:  None    New Prescriptions New Prescriptions   No medications on file     Geoffery Lyons, MD 06/18/16 1553

## 2016-06-18 NOTE — ED Triage Notes (Signed)
Per EMS: pt from work c/o left sided numbness and HA starting today at 1030

## 2016-12-23 DIAGNOSIS — Z23 Encounter for immunization: Secondary | ICD-10-CM | POA: Diagnosis not present

## 2017-01-01 ENCOUNTER — Ambulatory Visit
Admission: RE | Admit: 2017-01-01 | Discharge: 2017-01-01 | Disposition: A | Discharge status: Home / Self Care | Payer: BLUE CROSS/BLUE SHIELD | Source: Ambulatory Visit | Attending: Internal Medicine | Admitting: Internal Medicine

## 2017-01-01 ENCOUNTER — Other Ambulatory Visit: Payer: Self-pay | Admitting: Internal Medicine

## 2017-01-01 DIAGNOSIS — S0592XA Unspecified injury of left eye and orbit, initial encounter: Secondary | ICD-10-CM

## 2017-01-01 DIAGNOSIS — S0590XA Unspecified injury of unspecified eye and orbit, initial encounter: Secondary | ICD-10-CM | POA: Diagnosis not present

## 2017-01-02 DIAGNOSIS — S0012XA Contusion of left eyelid and periocular area, initial encounter: Secondary | ICD-10-CM | POA: Diagnosis not present

## 2017-01-02 DIAGNOSIS — H34211 Partial retinal artery occlusion, right eye: Secondary | ICD-10-CM | POA: Diagnosis not present

## 2017-02-15 DIAGNOSIS — N390 Urinary tract infection, site not specified: Secondary | ICD-10-CM | POA: Diagnosis not present

## 2017-02-25 DIAGNOSIS — N390 Urinary tract infection, site not specified: Secondary | ICD-10-CM | POA: Diagnosis not present

## 2017-08-02 DIAGNOSIS — S63502A Unspecified sprain of left wrist, initial encounter: Secondary | ICD-10-CM | POA: Diagnosis not present

## 2018-02-23 ENCOUNTER — Emergency Department (HOSPITAL_BASED_OUTPATIENT_CLINIC_OR_DEPARTMENT_OTHER)
Admission: EM | Admit: 2018-02-23 | Discharge: 2018-02-23 | Disposition: A | Discharge status: Home / Self Care | Payer: BLUE CROSS/BLUE SHIELD | Attending: Emergency Medicine | Admitting: Emergency Medicine

## 2018-02-23 ENCOUNTER — Emergency Department (HOSPITAL_BASED_OUTPATIENT_CLINIC_OR_DEPARTMENT_OTHER): Payer: BLUE CROSS/BLUE SHIELD

## 2018-02-23 ENCOUNTER — Other Ambulatory Visit: Payer: Self-pay

## 2018-02-23 ENCOUNTER — Encounter (HOSPITAL_BASED_OUTPATIENT_CLINIC_OR_DEPARTMENT_OTHER): Payer: Self-pay | Admitting: Emergency Medicine

## 2018-02-23 DIAGNOSIS — Z8673 Personal history of transient ischemic attack (TIA), and cerebral infarction without residual deficits: Secondary | ICD-10-CM | POA: Insufficient documentation

## 2018-02-23 DIAGNOSIS — Y9301 Activity, walking, marching and hiking: Secondary | ICD-10-CM | POA: Insufficient documentation

## 2018-02-23 DIAGNOSIS — Z79899 Other long term (current) drug therapy: Secondary | ICD-10-CM | POA: Insufficient documentation

## 2018-02-23 DIAGNOSIS — S82892A Other fracture of left lower leg, initial encounter for closed fracture: Secondary | ICD-10-CM

## 2018-02-23 DIAGNOSIS — S82832A Other fracture of upper and lower end of left fibula, initial encounter for closed fracture: Secondary | ICD-10-CM | POA: Diagnosis not present

## 2018-02-23 DIAGNOSIS — F17210 Nicotine dependence, cigarettes, uncomplicated: Secondary | ICD-10-CM | POA: Diagnosis not present

## 2018-02-23 DIAGNOSIS — Y998 Other external cause status: Secondary | ICD-10-CM | POA: Insufficient documentation

## 2018-02-23 DIAGNOSIS — W010XXA Fall on same level from slipping, tripping and stumbling without subsequent striking against object, initial encounter: Secondary | ICD-10-CM | POA: Insufficient documentation

## 2018-02-23 DIAGNOSIS — S99912A Unspecified injury of left ankle, initial encounter: Secondary | ICD-10-CM | POA: Diagnosis not present

## 2018-02-23 DIAGNOSIS — Y9289 Other specified places as the place of occurrence of the external cause: Secondary | ICD-10-CM | POA: Insufficient documentation

## 2018-02-23 MED ORDER — ACETAMINOPHEN ER 650 MG PO TBCR: 650.0000 mg | EXTENDED_RELEASE_TABLET | Freq: Three times a day (TID) | ORAL | 0 refills | Status: DC

## 2018-02-23 MED ORDER — ACETAMINOPHEN 325 MG PO TABS
650.0000 mg | ORAL_TABLET | Freq: Once | ORAL | Status: AC
  Administered 2018-02-23: 650 mg via ORAL
  Filled 2018-02-23: qty 2

## 2018-02-23 NOTE — ED Provider Notes (Signed)
[facility] EMERGENCY DEPARTMENT Provider Note   CSN: [id number] Arrival date & time: 02/23/18  4098     History   Chief Complaint Chief Complaint  Patient presents with  . Ankle Pain    HPI Sara Collins is a 47 y.o. female.  HPI  47 year old female with history of TIA comes in with chief complaint of fall and ankle pain.  Patient reports that she slipped over water and twisted her ankle.  Patient has been having pain since the injury, and was unable to sleep well because of it.  Patient is having difficulty bearing weight.  Her pain is primarily over the lateral aspect of her ankle and the top of her foot.   Past Medical History:  Diagnosis Date  . Mitral valve prolapse   . Pinched nerve   . Sciatica   . TIA (transient ischemic attack)     There are no active problems to display for this patient.   History reviewed. No pertinent surgical history.   OB History   None      Home Medications    Prior to Admission medications   Medication Sig Start Date End Date Taking? Authorizing Provider  acetaminophen (TYLENOL 8 HOUR) 650 MG CR tablet Take 1 tablet (650 mg total) by mouth every 8 (eight) hours. 02/23/18   Derwood Kaplan, MD  loratadine (CLARITIN) 10 MG tablet Take 10 mg by mouth daily.    [provider]  methocarbamol (ROBAXIN) 500 MG tablet Take 2 tablets (1,000 mg total) by mouth 4 (four) times daily. Patient not taking: Reported on 06/18/2016 01/04/14   Renne Crigler, PA-C  Multiple Vitamin (MULTIVITAMIN WITH MINERALS) TABS tablet Take 1 tablet by mouth daily.    [provider]  nitrofurantoin, macrocrystal-monohydrate, (MACROBID) 100 MG capsule Take 100 mg by mouth 2 (two) times daily. 06/07/16   [provider]    Family History No family history on file.  Social History Social History   Tobacco Use  . Smoking status: Current Every Day Smoker  . Smokeless tobacco: Never Used  Substance Use Topics  . Alcohol  use: Yes  . Drug use: No     Allergies   Sulfa antibiotics   Review of Systems Review of Systems  Constitutional: Positive for activity change.  Musculoskeletal: Positive for arthralgias and gait problem.  Skin: Negative for wound.  Neurological: Negative for numbness.     Physical Exam Updated Vital Signs BP 112/76 (BP Location: Right Arm)   Pulse 86   Temp 98.1 F (36.7 C) (Oral)   Resp 16   Ht 5\' 7"  (1.702 m)   Wt 71.2 kg   SpO2 99%   BMI 24.59 kg/m   Physical Exam  Constitutional: She is oriented to person, place, and time. She appears well-developed.  HENT:  Head: Atraumatic.  Eyes: EOM are normal.  Cardiovascular: Normal rate.  Pulmonary/Chest: Effort normal.  Abdominal: Bowel sounds are normal.  Musculoskeletal: She exhibits edema and tenderness. She exhibits no deformity.  Left ankle is edematous, worst over the lateral malleoli region and anterior mortise.  Neurological: She is alert and oriented to person, place, and time.  Skin: Skin is warm.  Nursing note and vitals reviewed.    ED Treatments / Results  Labs (all labs ordered are listed, but only abnormal results are displayed) Labs Reviewed - No data to display  EKG None  Radiology Dg Ankle Complete Left  Result Date: 02/23/2018 CLINICAL DATA:  Left lateral ankle pain and  swelling. EXAM: LEFT ANKLE COMPLETE - 3+ VIEW COMPARISON:  None. FINDINGS: There is a transverse minimally displaced fracture through the distal left fibula with extension to the ankle mortise. The ankle mortise itself appears symmetric. Marked lateral left ankle soft tissue swelling. IMPRESSION: Transverse minimally displaced intra-articular fracture through the distal left fibula. Electronically Signed   By: Ted Mcalpine M.D.   On: 02/23/2018 08:39    Procedures Procedures (including critical care time)  Medications Ordered in ED Medications  acetaminophen (TYLENOL) tablet 650 mg (650 mg Oral Given 02/23/18 0926)      Initial Impression / Assessment and Plan / ED Course  I have reviewed the triage vital signs and the nursing notes.  Pertinent labs & imaging results that were available during my care of the patient were reviewed by me and considered in my medical decision making (see chart for details).     47 year old female comes in with chief complaint of ankle pain.  On exam she has significantly edematous lateral ankle on the left side, with tenderness to palpation. X-ray showing minimally displaced fibular fracture distally.  We will apply a posterior splint and give crutches.  Patient has been advised to be nonweightbearing and follow-up with orthopedic doctors in 7 to 14 days.  Final Clinical Impressions(s) / ED Diagnoses   Final diagnoses:  Closed fracture of left ankle, initial encounter    ED Discharge Orders         Ordered    acetaminophen (TYLENOL 8 HOUR) 650 MG CR tablet  Every 8 hours     02/23/18 0915           Derwood Kaplan, MD 02/23/18 1037

## 2018-02-23 NOTE — ED Triage Notes (Signed)
Pt slipped on water last night and fell. C/o L ankle pain, swelling noted.

## 2018-02-23 NOTE — Discharge Instructions (Signed)
X-rays show that you have a nondisplaced fibular fracture.  This type of fracture is at high risk of requiring surgical care, however in your situation you might heal well and not require surgery.  It is prudent that you do not put any weight on your leg. Please see the orthopedic doctor in 7 to 10 days. Take Tylenol around-the-clock for pain control keep the ankle elevated.

## 2018-03-06 DIAGNOSIS — M25572 Pain in left ankle and joints of left foot: Secondary | ICD-10-CM | POA: Diagnosis not present

## 2018-03-06 DIAGNOSIS — S99912D Unspecified injury of left ankle, subsequent encounter: Secondary | ICD-10-CM | POA: Diagnosis not present

## 2018-03-27 DIAGNOSIS — S99912D Unspecified injury of left ankle, subsequent encounter: Secondary | ICD-10-CM | POA: Diagnosis not present

## 2018-04-07 DIAGNOSIS — M25572 Pain in left ankle and joints of left foot: Secondary | ICD-10-CM | POA: Diagnosis not present

## 2018-04-07 DIAGNOSIS — S8265XD Nondisplaced fracture of lateral malleolus of left fibula, subsequent encounter for closed fracture with routine healing: Secondary | ICD-10-CM | POA: Diagnosis not present

## 2018-05-07 DIAGNOSIS — M25572 Pain in left ankle and joints of left foot: Secondary | ICD-10-CM | POA: Diagnosis not present

## 2018-05-07 DIAGNOSIS — S8265XD Nondisplaced fracture of lateral malleolus of left fibula, subsequent encounter for closed fracture with routine healing: Secondary | ICD-10-CM | POA: Diagnosis not present

## 2018-06-04 DIAGNOSIS — S8265XD Nondisplaced fracture of lateral malleolus of left fibula, subsequent encounter for closed fracture with routine healing: Secondary | ICD-10-CM | POA: Diagnosis not present

## 2018-06-04 DIAGNOSIS — M25572 Pain in left ankle and joints of left foot: Secondary | ICD-10-CM | POA: Diagnosis not present

## 2018-06-18 DIAGNOSIS — N390 Urinary tract infection, site not specified: Secondary | ICD-10-CM | POA: Diagnosis not present

## 2021-04-03 ENCOUNTER — Encounter: Payer: Self-pay | Admitting: Family Medicine

## 2021-04-03 ENCOUNTER — Ambulatory Visit: Payer: PRIVATE HEALTH INSURANCE | Admitting: Family Medicine

## 2021-04-03 ENCOUNTER — Other Ambulatory Visit: Payer: Self-pay

## 2021-04-03 VITALS — BP 106/80 | HR 65 | Temp 97.3°F | Ht 66.0 in | Wt 174.2 lb

## 2021-04-03 DIAGNOSIS — I341 Nonrheumatic mitral (valve) prolapse: Secondary | ICD-10-CM

## 2021-04-03 DIAGNOSIS — Z1211 Encounter for screening for malignant neoplasm of colon: Secondary | ICD-10-CM | POA: Diagnosis not present

## 2021-04-03 DIAGNOSIS — Z1231 Encounter for screening mammogram for malignant neoplasm of breast: Secondary | ICD-10-CM

## 2021-04-03 DIAGNOSIS — L409 Psoriasis, unspecified: Secondary | ICD-10-CM

## 2021-04-03 NOTE — Progress Notes (Signed)
[facility] [address] [place] [address] Dept: [phone] Dept Fax: [phone]  New Patient Office Visit  Subjective:    Patient ID: Sara Collins, female    DOB: [birth year], 50 y.o..   MRN: [id number]  Chief Complaint  Patient presents with   Establish Care    NP. Est care. No main concerns.     History of Present Illness:  Patient is in today to establish care. Sara Collins was born in [place], [place]. Her father was a Public house manager at [employer]. They later moved to [place] and then [facility]. She attended college at [employer] in Sales executive. She divorced in 2010 and moved from [place], [place] to [place]. She is currently working as a Merchandiser, retail for [employer]. She has 2 children (14, 18). She denies any tobacco or drug use. She admits to weekend alcohol use until recently, as she is trying to lose weight.  Sara Collins has a history of mitral valve prolapse. She notes this has never needed to be treated. She occasionally gets a hand tremor with caffeine use.   Sara Collins notes a history of severe psoriasis that had covered 85% of her body. She started on Tremfya (guselkumab) in Oct. and has seen a dramatic clearing of her skin. She does have joint aches at time. She notes her current health insurance will change on Jan. 1, so Dr. Leonie Collins (dermatology) is holding off on lab screening until after this new insurance starts.  Past Medical History: Patient Active Problem List   Diagnosis Date Noted   Mitral valve prolapse 04/03/2021   Psoriasis 04/03/2021   Past Surgical History:  Procedure Laterality Date   HYSTEROSALPINGOGRAM     LAPAROSCOPY WITH CO2 LASER     Endometriosis   Family History  Problem Relation Age of Onset   Hyperlipidemia Mother    Stroke Father    Heart disease Father        A. fib   Outpatient Medications Prior to Visit   Medication Sig Dispense Refill   Guselkumab (TREMFYA [place]) Inject 100 mg into the skin every 8 (eight) weeks.     Multiple Vitamin (MULTIVITAMIN WITH MINERALS) TABS tablet Take 1 tablet by mouth daily.     acetaminophen (TYLENOL 8 HOUR) 650 MG CR tablet Take 1 tablet (650 mg total) by mouth every 8 (eight) hours. 30 tablet 0   loratadine (CLARITIN) 10 MG tablet Take 10 mg by mouth daily.     methocarbamol (ROBAXIN) 500 MG tablet Take 2 tablets (1,000 mg total) by mouth 4 (four) times daily. (Patient not taking: Reported on 06/18/2016) 20 tablet 0   nitrofurantoin, macrocrystal-monohydrate, (MACROBID) 100 MG capsule Take 100 mg by mouth 2 (two) times daily.  0   triamcinolone cream (KENALOG) 0.1 % APPLY SPARINGLY TO AFFECTED AREA TWICE A DAY     No facility-administered medications prior to visit.   Allergies  Allergen Reactions   Sulfa Antibiotics Anaphylaxis     Objective:   Today's Vitals   04/03/21 1503  BP: 106/80  Pulse: 65  Temp: (!) 97.3 F (36.3 C)  TempSrc: Temporal  SpO2: 98%  Weight: 174 lb 3.2 oz (79 kg)  Height: 5\' 6"  (1.676 m)   Body mass index is 28.12 kg/m.   General: Well developed, well nourished. No acute distress. Skin: Warm and dry. There is some old scarring of the extremities and some mild redness around the joints  and paronychia. No scaliness noted. Psych: Alert and oriented. Normal mood and affect.  Health Maintenance Due  Topic Date Due   Pneumococcal Vaccine 67-35 Years old (1 - PCV) Never done   HIV Screening  Never done   Hepatitis C Screening  Never done   PAP SMEAR-Modifier  Never done   COLONOSCOPY (Pts 45-46yrs Insurance coverage will need to be confirmed)  Never done     Assessment & Plan:   1. Psoriasis Sara Collins appears to have had a dramatic response to Tremfya. She will continue to follow with dermatology.  2. Mitral valve prolapse Stable. No significant issues with this.  3. Encounter for screening mammogram for malignant  neoplasm of breast  - MM DIGITAL SCREENING BILATERAL; Future  4. Screening for colon cancer  - Ambulatory referral to Gastroenterology  Loyola Mast, MD

## 2021-05-09 ENCOUNTER — Other Ambulatory Visit: Payer: Self-pay

## 2021-05-09 ENCOUNTER — Ambulatory Visit
Admission: RE | Admit: 2021-05-09 | Discharge: 2021-05-09 | Disposition: A | Discharge status: Home / Self Care | Payer: BC Managed Care – PPO | Source: Ambulatory Visit | Attending: Family Medicine | Admitting: Family Medicine

## 2021-05-09 DIAGNOSIS — Z1231 Encounter for screening mammogram for malignant neoplasm of breast: Secondary | ICD-10-CM

## 2021-05-10 DIAGNOSIS — L400 Psoriasis vulgaris: Secondary | ICD-10-CM | POA: Diagnosis not present

## 2021-05-10 DIAGNOSIS — Z79899 Other long term (current) drug therapy: Secondary | ICD-10-CM | POA: Diagnosis not present

## 2021-05-11 ENCOUNTER — Other Ambulatory Visit: Payer: Self-pay

## 2021-05-12 ENCOUNTER — Ambulatory Visit: Payer: BC Managed Care – PPO | Admitting: Family Medicine

## 2021-05-12 ENCOUNTER — Other Ambulatory Visit (HOSPITAL_COMMUNITY)
Admission: RE | Admit: 2021-05-12 | Discharge: 2021-05-12 | Disposition: A | Discharge status: Home / Self Care | Payer: BC Managed Care – PPO | Source: Ambulatory Visit | Attending: Diagnostic Radiology | Admitting: Diagnostic Radiology

## 2021-05-12 VITALS — BP 120/76 | HR 88 | Temp 97.4°F | Ht 66.0 in | Wt 175.8 lb

## 2021-05-12 DIAGNOSIS — L409 Psoriasis, unspecified: Secondary | ICD-10-CM | POA: Diagnosis not present

## 2021-05-12 DIAGNOSIS — Z124 Encounter for screening for malignant neoplasm of cervix: Secondary | ICD-10-CM

## 2021-05-12 NOTE — Progress Notes (Signed)
°  [facility] [address] [place] [address] Dept: [phone] Dept Fax: [phone]  Office Visit  Subjective:    Patient ID: Sara Collins, female    DOB: May 06, 1970, 51 y.o..   MRN: [id number]  Chief Complaint  Patient presents with   Follow-up    4 week f/u for pap.     History of Present Illness:  Patient is in today for follow-up on her psoriasis treatment and for her pap smear.  Ms. Brich is cuurently managed on Tremfya for her psoriasis. She has had dramatic clearance of her rash since starting on this medication in Oct. She went this week for her injection and her dermatologist is pleased with her progress.  Review of Systems  Genitourinary:  Positive for urgency. Negative for dysuria, flank pain, frequency and hematuria.  No vaginal discharge.  Past Medical History: Patient Active Problem List   Diagnosis Date Noted   Mitral valve prolapse 04/03/2021   Psoriasis 04/03/2021   Past Surgical History:  Procedure Laterality Date   HYSTEROSALPINGOGRAM     LAPAROSCOPY WITH CO2 LASER     Endometriosis   Family History  Problem Relation Age of Onset   Hyperlipidemia Mother    Stroke Father    Heart disease Father        A. fib   Outpatient Medications Prior to Visit  Medication Sig Dispense Refill   acetaminophen (TYLENOL 8 HOUR) 650 MG CR tablet Take 1 tablet (650 mg total) by mouth every 8 (eight) hours. 30 tablet 0   Guselkumab (TREMFYA [place]) Inject 100 mg into the skin every 8 (eight) weeks.     No facility-administered medications prior to visit.   Allergies  Allergen Reactions   Sulfa Antibiotics Anaphylaxis     Objective:   Today's Vitals   05/12/21 1457  BP: 120/76  Pulse: 88  Temp: (!) 97.4 F (36.3 C)  TempSrc: Temporal  SpO2: 97%  Weight: 175 lb 12.8 oz (79.7 kg)  Height: 5\' 6"  (1.676 m)   Body mass index is 28.37 kg/m.   General: Well developed, well nourished. No acute  distress. GU: Normal external genitalia. Vaginal vault is pink without discharge. Cervix is pink and appears normal. No   CMT. Uterus normal size. No adnexal masses. No anterior or posterior wall prolapse. Psych: Alert and oriented. Normal mood and affect.  Health Maintenance Due  Topic Date Due   HIV Screening  Never done   Hepatitis C Screening  Never done   PAP SMEAR-Modifier  Never done   COLONOSCOPY (Pts 45-68yrs Insurance coverage will need to be confirmed)  Never done     Assessment & Plan:   1. Psoriasis Much improved on Tremfya. Continue to foolw with dermatology.  2. Screening for cervical cancer Completed today.  - Cytology - PAP  Haydee Salter, MD

## 2021-05-17 LAB — CYTOLOGY - PAP
Comment: NEGATIVE
Diagnosis: NEGATIVE
High risk HPV: NEGATIVE

## 2021-07-05 NOTE — Telephone Encounter (Signed)
LM on vmail to call back to schedule appointment ?

## 2021-11-10 ENCOUNTER — Ambulatory Visit: Payer: BC Managed Care – PPO | Admitting: Family Medicine

## 2021-11-13 ENCOUNTER — Ambulatory Visit: Payer: BC Managed Care – PPO | Admitting: Family Medicine

## 2021-11-28 DIAGNOSIS — L400 Psoriasis vulgaris: Secondary | ICD-10-CM | POA: Diagnosis not present

## 2021-11-28 DIAGNOSIS — Z79899 Other long term (current) drug therapy: Secondary | ICD-10-CM | POA: Diagnosis not present

## 2021-12-15 ENCOUNTER — Ambulatory Visit (INDEPENDENT_AMBULATORY_CARE_PROVIDER_SITE_OTHER): Payer: BC Managed Care – PPO | Admitting: Family Medicine

## 2021-12-15 ENCOUNTER — Encounter: Payer: Self-pay | Admitting: Family Medicine

## 2021-12-15 VITALS — BP 124/80 | HR 62 | Temp 97.9°F | Ht 66.0 in | Wt 184.6 lb

## 2021-12-15 DIAGNOSIS — F17201 Nicotine dependence, unspecified, in remission: Secondary | ICD-10-CM | POA: Insufficient documentation

## 2021-12-15 DIAGNOSIS — L409 Psoriasis, unspecified: Secondary | ICD-10-CM | POA: Diagnosis not present

## 2021-12-15 DIAGNOSIS — Z720 Tobacco use: Secondary | ICD-10-CM | POA: Diagnosis not present

## 2021-12-15 NOTE — Progress Notes (Signed)
  [facility] [address] [place] [address] Dept: [phone] Dept Fax: [phone]  Office Visit  Subjective:    Patient ID: Sara Collins, female    DOB: [birth year], 51 y.o..   MRN: [id number]  Chief Complaint  Patient presents with   Follow-up    6 month f/u. No concerns.     History of Present Illness:  [name] has a history of psoriasis. Last Oct., she was started on Tremfya. She has had very dramatic clearing of her skin and improvements in her hair. She has historically used regular tanning to reduce her rash. Now that he skin has cleared, her dermatologist has recommended she consider cutting back/stopping tanning. [name] admits she has had some occasional URIs over the year, but has tried to be careful and monitor this. She does till smoke a rare cigarette, but his is less than once a week.  Past Medical History: Patient Active Problem List   Diagnosis Date Noted   Tobacco use 12/15/2021   Mitral valve prolapse 04/03/2021   Psoriasis 04/03/2021   Past Surgical History:  Procedure Laterality Date   HYSTEROSALPINGOGRAM     LAPAROSCOPY WITH CO2 LASER     Endometriosis   Family History  Problem Relation Age of Onset   Hyperlipidemia Mother    Stroke Father    Heart disease Father        A. fib   Outpatient Medications Prior to Visit  Medication Sig Dispense Refill   acetaminophen (TYLENOL 8 HOUR) 650 MG CR tablet Take 1 tablet (650 mg total) by mouth every 8 (eight) hours. 30 tablet 0   Guselkumab (TREMFYA [place]) Inject 100 mg into the skin every 8 (eight) weeks.     No facility-administered medications prior to visit.   Allergies  Allergen Reactions   Sulfa Antibiotics Anaphylaxis     Objective:   Today's Vitals   12/15/21 1546  BP: 124/80  Pulse: 62  Temp: 97.9 F (36.6 C)  TempSrc: Temporal  SpO2: 96%  Weight: 184 lb 9.6 oz (83.7 kg)  Height: 5\' 6"  (1.676 m)   Body mass  index is 29.8 kg/m.   General: Well developed, well nourished. No acute distress. Skin: Warm and dry. No visible rash at present. Psych: Alert and oriented. Normal mood and affect.  Health Maintenance Due  Topic Date Due   HIV Screening  Never done   Hepatitis C Screening  Never done   COLONOSCOPY (Pts 45-19yrs Insurance coverage will need to be confirmed)  Never done   INFLUENZA VACCINE  11/21/2021     Assessment & Plan:   1. Psoriasis Continue use of Tremfya for maintaining clearance of severe psoriasis. I echoed the dermatologist concern about ongoing tanning, due to risk for skin cancer.  2. Tobacco use Overall very light use, but I still recommend avoidance of tobacco.  Return in about 1 year (around 12/16/2022) for Annual preventative care.   12/18/2022, MD

## 2022-01-29 DIAGNOSIS — Z111 Encounter for screening for respiratory tuberculosis: Secondary | ICD-10-CM | POA: Diagnosis not present

## 2022-03-26 MED ORDER — TREMFYA 100 MG/ML SUBCUTANEOUS AUTO-INJECTOR
SUBCUTANEOUS | 4 refills | 0 days
Start: 2022-03-26 — End: ?

## 2022-05-15 DIAGNOSIS — L400 Psoriasis vulgaris: Principal | ICD-10-CM

## 2022-05-29 DIAGNOSIS — L400 Psoriasis vulgaris: Secondary | ICD-10-CM | POA: Diagnosis not present

## 2022-05-29 DIAGNOSIS — Z79899 Other long term (current) drug therapy: Secondary | ICD-10-CM | POA: Diagnosis not present

## 2022-05-29 MED ORDER — TREMFYA 100 MG/ML SUBCUTANEOUS AUTO-INJECTOR
4 refills | 0 days
Start: 2022-05-29 — End: ?

## 2022-05-30 NOTE — Unmapped (Signed)
[facility] Clinical Intervention    Type of intervention: Medication access    Medication involved: Tremfya 100mg /mL     Problem identified: Prior Authorization required. Next dose due 2/11 per patient.    Intervention performed: Patient called in regarding her next dose of Tremfya is due on 2/11 and wanted to set up her delivery as we are a new pharmacy for her.  Upon test claiming medication it was determined a prior authorization was needed. Patient stated she saw the provider yesterday.  I then called the clinic to ensure they will submit an urgent prior authorization.  I was informed that they were waiting for her to be seen in clinic before they would sumbit the PA but they will now go ahead and submit it today but are unsure of turn-around time for approval.  I called patient to provide an update but had to leave a voicemail. Will check on PA status tomorrow to see if approved as we would then be able to still get out to her for Friday so she'd have in time for her next dose.  I did let her know that chances are it won't be approved until early next week, unfortunately.  I asked if she hear anything to please update [place].     Follow-up needed: Follow-up to schedule medication once prior authorization has been approved.     Approximate time spent: 15-20 minutes    Clinical evidence used to support intervention: Professional judgement    Result of the intervention: Improved medication adherence    Teofilo Pod, PharmD   [facility] Specialty Pharmacist

## 2022-06-04 MED ORDER — EMPTY CONTAINER
2 refills | 0 days
Start: 2022-06-04 — End: ?

## 2022-06-04 NOTE — Unmapped (Signed)
[facility]   Patient Onboarding/Medication Counseling    Nina Vargas is a 52 y.o. female with psoriasis who I am counseling today on continuation of therapy.  I am speaking to the patient.    Was a Nurse, learning disability used for this call? No    Verified patient's date of birth / HIPAA.    Specialty medication(s) to be sent: Inflammatory Disorders: Tremfya      Non-specialty medications/supplies to be sent: sharps kit      Medications not needed at this time: n/a         Tremfya (guselkumab)    The patient declined counseling on medication administration, goals of therapy, side effects and monitoring parameters, and warnings and precautions because they have taken the medication previously. The information in the declined sections below are for informational purposes only and was not discussed with patient.       Medication & Administration     Dosage: Plaque psoriasis: Inject 100mg  under the skin at weeks 0 and 4, then every 8 weeks thereafter    Lab tests required prior to treatment initiation:  Tuberculosis:  patient is overdue for dose so sent without TB information to prevent delay in therapy .    Administration:       Systems developer all supplies needed for injection on a clean, flat working surface: medication pen removed from packaging, alcohol swab, sharps container, etc.  Look at the medication label - look for correct medication, correct dose, and check the expiration date  Look at the medication - the liquid visible in the window on the side of the pen device should appear clear and colorless to slightly yellow and may contain tiny white or clear particles  Lay the auto-injector pen on a flat surface and allow it to warm up to room temperature for at least 30 minutes  Select injection site - you can use the front of your thigh or your belly (but not the area 2 inches around your belly button); if someone else is giving you the injection you can also use your upper arm in the skin covering your triceps muscle  Prepare injection site - wash your hands and clean the skin at the injection site with an alcohol swab and let it air dry, do not touch the injection site again before the injection  Twist and pull off the bottom safety cap, do not remove until immediately prior to injection and do not touch the white needle cover  Position the injector against your skin at the injection site at a 90 degree angle, hold the pen such that you can see the clear medication window  Push the injection handle straight down, the medication will inject as you push so push at the speed that is comfortable for you  The injection is complete when the handle is pushed all the way down - you will hear a click and the teal body of the device is no longer visible; once this is complete then pull the pen straight up and away from your skin; the yellow band indicates that the needle guard is locked  Dispose of the used auto-injector pen immediately in your sharps disposal container the needle will be covered automatically  If you see any blood at the injection site, press a cotton ball or gauze on the site and maintain pressure until the bleeding stops, do not rub the injection site      Adherence/Missed dose instructions:  If your  injection is given more than 7 days after your scheduled injection date - consult your pharmacist for additional instructions on how to adjust your dosing schedule.    Goals of Therapy     Minimize areas of skin involvement (% BSA)  Avoidance of long term glucocorticoid use  Maintenance of effective psychosocial functioning      Side Effects & Monitoring Parameters     Injection site reaction (redness, irritation, inflammation localized to the site of administration)  Signs of a common cold - minor sore throat, runny or stuffy nose, etc.  Joint pain  Headache    The following side effects should be reported to the provider:  Signs of a hypersensitivity reaction - rash; hives; itching; red, swollen, blistered, or peeling skin; wheezing; tightness in the chest or throat; difficulty breathing, swallowing, or talking; swelling of the mouth, face, lips, tongue, or throat; etc.  Reduced immune function - report signs of infection such as fever; chills; body aches; very bad sore throat; ear or sinus pain; cough; more sputum or change in color of sputum; pain with passing urine; wound that will not heal, etc.  Also at a slightly higher risk of some malignancies (mainly skin and blood cancers) due to this reduced immune function.  In the case of signs of infection - the patient should hold the next dose of Tremfya?? and call your primary care provider to ensure adequate medical care.  Treatment may be resumed when infection is treated and patient is asymptomatic.  Cough with or without phlegm or blood  Shortness of breath  Stomach pain or diarrhea      Contraindications, Warnings, & Precautions     Have your bloodwork checked as you have been told by your prescriber  Talk with your doctor if you are pregnant, planning to become pregnant, or breastfeeding  Discuss the possible need for holding your dose(s) of Tremfya?? when a planned procedure is scheduled with the prescriber as it may delay healing/recovery timeline       Drug/Food Interactions     Medication list reviewed in Epic. The patient was instructed to inform the care team before taking any new medications or supplements. No drug interactions identified.   Talk with you prescriber or pharmacist before receiving any live vaccinations while taking this medication and after you stop taking it    Storage, Handling Precautions, & Disposal     Store this medication in the refrigerator.  Do not freeze  If needed, you may store at room temperature for up to 4 hours  Store in original packaging, protected from light  Do not shake  Dispose of used syringes/pens in a sharps disposal container      Current Medications (including OTC/herbals), Comorbidities and Allergies     Current Outpatient Medications   Medication Sig Dispense Refill    guselkumab (TREMFYA) 100 mg/mL [name] Inject the contents of 1 auto-injector (100 mg total) under the skin every 8 weeks. 1 mL 4    guselkumab (TREMFYA) 100 mg/mL [name] Inject the contents of 1 pen (100 mg) under the skin every 8 weeks as maintenance 1 mL 4     No current facility-administered medications for this visit.       Not on File    There is no problem list on file for this patient.      Reviewed and up to date in Epic.    Appropriateness of Therapy     Acute infections noted within Epic:  No active  infections  Patient reported infection: None    Is medication and dose appropriate based on diagnosis and infection status? Yes    Prescription has been clinically reviewed: Yes      Baseline Quality of Life Assessment      How many days over the past month did your psoriasis  keep you from your normal activities? For example, brushing your teeth or getting up in the morning. 0    Financial Information     Medication Assistance provided: Prior Authorization and Copay Assistance    Anticipated copay of $0-5 reviewed with patient. Verified delivery address.    Delivery Information     Scheduled delivery date: 2/14    Expected start date: 2/15    Patient was notified of new phone menu: No    Medication will be delivered via UPS to the prescription address in Epic [place].  This shipment will not require a signature.      Explained the services we provide at [facility] and that each month we would call to set up refills.  Stressed importance of returning phone calls so that we could ensure they receive their medications in time each month.  Informed patient that we should be setting up refills 7-10 days prior to when they will run out of medication.  A pharmacist will reach out to perform a clinical assessment periodically.  Informed patient that a welcome packet, containing information about our pharmacy and other support services, a Notice of Privacy Practices, and a drug information handout will be sent.      The patient or caregiver noted above participated in the development of this care plan and knows that they can request review of or adjustments to the care plan at any time.      Patient or caregiver verbalized understanding of the above information as well as how to contact the pharmacy at [phone] option 4 with any questions/concerns.  The pharmacy is open Monday through Friday 8:30am-4:30pm.  A pharmacist is available 24/7 via pager to answer any clinical questions they may have.    Patient Specific Needs     Does the patient have any physical, cognitive, or cultural barriers? No    Does the patient have adequate living arrangements? (i.e. the ability to store and take their medication appropriately) Yes    Did you identify any home environmental safety or security hazards? No    Patient prefers to have medications discussed with  Patient     Is the patient or caregiver able to read and understand education materials at a high school level or above? Yes    Patient's primary language is  English     Is the patient high risk? No    SOCIAL DETERMINANTS OF HEALTH     At the [facility], we have learned that life circumstances - like trouble affording food, housing, utilities, or transportation can affect the health of many of our patients.   That is why we wanted to ask: are you currently experiencing any life circumstances that are negatively impacting your health and/or quality of life? Patient declined to answer    Social Determinants of Health     Financial Resource Strain: Not on file   Internet Connectivity: Not on file   Food Insecurity: Not on file   Tobacco Use: Not on file   Housing/Utilities: Not on file   Alcohol Use: Not on file   Transportation Needs: Not on file   Substance  Use: Not on file   Health Literacy: Not on file   Physical Activity: Not on file   Interpersonal Safety: Not on file   Stress: Not on file   Intimate Partner Violence: Not on file   Depression: Not on file   Social Connections: Not on file       Would you be willing to receive help with any of the needs that you have identified today? Not applicable       Clydell Hakim, PharmD  [facility] Specialty Pharmacist

## 2022-06-05 MED FILL — TREMFYA 100 MG/ML SUBCUTANEOUS AUTO-INJECTOR: 56 days supply | Qty: 1 | Fill #0

## 2022-06-05 MED FILL — EMPTY CONTAINER: 120 days supply | Qty: 1 | Fill #0

## 2022-07-30 NOTE — Unmapped (Signed)
[facility] Refill Coordination Note    Specialty Medication(s) to be Shipped:   Inflammatory Disorders: Tremfya    Other medication(s) to be shipped: No additional medications requested for fill at this time     Nina Vargas, DOB: [birth year]  Phone: [phone] (home)       All above HIPAA information was verified with patient.     Was a Nurse, learning disability used for this call? No    Completed refill call assessment today to schedule patient's medication shipment from the [facility] [phone]).  All relevant notes have been reviewed.     Specialty medication(s) and dose(s) confirmed: Regimen is correct and unchanged.   Changes to medications: [name] reports no changes at this time.  Changes to insurance: No  New side effects reported not previously addressed with a pharmacist or physician: None reported  Questions for the pharmacist: No    Confirmed patient received a Conservation officer, historic buildings and a Surveyor, mining with first shipment. The patient will receive a drug information handout for each medication shipped and additional FDA Medication Guides as required.       DISEASE/MEDICATION-SPECIFIC INFORMATION        For patients on injectable medications: Patient currently has 0 doses left.  Next injection is scheduled for 4/10.    SPECIALTY MEDICATION ADHERENCE     Medication Adherence    Patient reported X missed doses in the last month: 0  Specialty Medication: TREMFYA 100 mg/mL  Patient is on additional specialty medications: No              Were doses missed due to medication being on hold? No    Tremfya 100 mg/ml: 0 days of medicine on hand        REFERRAL TO PHARMACIST     Referral to the pharmacist: Not needed      [facility]     Shipping address confirmed in Epic.     Patient was notified of new phone menu : No    Delivery Scheduled: Yes, Expected medication delivery date: 08/01/22.     Medication will be delivered via UPS to the prescription address in Epic [place].    Willette Pa   [facility] Specialty Technician

## 2022-07-31 NOTE — Unmapped (Signed)
Nina Vargas 's TREMFYA 100 mg/mL [name] (guselkumab) shipment will be delayed as a result of downtime     I have reached out to the patient  at [phone] and communicated the delivery change. We will reschedule the medication for the delivery date that the patient agreed upon.  We have confirmed the delivery date as 08/03/22

## 2022-08-02 MED FILL — TREMFYA 100 MG/ML SUBCUTANEOUS AUTO-INJECTOR: SUBCUTANEOUS | 56 days supply | Qty: 1 | Fill #0

## 2022-09-21 NOTE — Unmapped (Signed)
[facility] Refill Coordination Note    Specialty Medication(s) to be Shipped:   Inflammatory Disorders: Tremfya    Other medication(s) to be shipped: No additional medications requested for fill at this time     Nina Vargas, DOB: 1970/05/27  Phone: [phone] (home)       All above HIPAA information was verified with patient.     Was a Nurse, learning disability used for this call? No    Completed refill call assessment today to schedule patient's medication shipment from the [facility] [phone]).  All relevant notes have been reviewed.     Specialty medication(s) and dose(s) confirmed: Regimen is correct and unchanged.   Changes to medications: Nina Vargas reports no changes at this time.  Changes to insurance: No  New side effects reported not previously addressed with a pharmacist or physician: None reported  Questions for the pharmacist: No    Confirmed patient received a Conservation officer, historic buildings and a Surveyor, mining with first shipment. The patient will receive a drug information handout for each medication shipped and additional FDA Medication Guides as required.       DISEASE/MEDICATION-SPECIFIC INFORMATION        For patients on injectable medications: Patient currently has 0 doses left.  Next injection is scheduled for 09/28/22.    SPECIALTY MEDICATION ADHERENCE              Were doses missed due to medication being on hold? No    Tremfya 100 mg/ml: 7 days of medicine on hand       REFERRAL TO PHARMACIST     Referral to the pharmacist: Not needed      [facility]     Shipping address confirmed in Epic.       Delivery Scheduled: Yes, Expected medication delivery date: 09/25/22.     Medication will be delivered via UPS to the prescription address in Epic WAM.    Sherral Hammers, PharmD   [facility] Specialty Pharmacist

## 2022-09-24 MED FILL — TREMFYA 100 MG/ML SUBCUTANEOUS AUTO-INJECTOR: SUBCUTANEOUS | 56 days supply | Qty: 1 | Fill #1

## 2022-11-02 NOTE — Unmapped (Signed)
[facility] Refill Coordination Note    Specialty Medication(s) to be Shipped:   Inflammatory Disorders: Tremfya    Other medication(s) to be shipped: No additional medications requested for fill at this time     Meggen Ankenbauer, DOB: May 22, 1970  Phone: [phone] (home)       All above HIPAA information was verified with patient.     Was a Nurse, learning disability used for this call? No    Completed refill call assessment today to schedule patient's medication shipment from the [facility] [phone]).  All relevant notes have been reviewed.     Specialty medication(s) and dose(s) confirmed: Regimen is correct and unchanged.   Changes to medications: Kathalina reports no changes at this time.  Changes to insurance: No  New side effects reported not previously addressed with a pharmacist or physician: None reported  Questions for the pharmacist: No    Confirmed patient received a Conservation officer, historic buildings and a Surveyor, mining with first shipment. The patient will receive a drug information handout for each medication shipped and additional FDA Medication Guides as required.       DISEASE/MEDICATION-SPECIFIC INFORMATION        For patients on injectable medications: Patient currently has 0 doses left.  Next injection is scheduled for 11/25/22.    SPECIALTY MEDICATION ADHERENCE              Were doses missed due to medication being on hold? No    Tremfya 100 mg/ml: 23 days of medicine on hand       REFERRAL TO PHARMACIST     Referral to the pharmacist: Not needed      [facility]     Shipping address confirmed in Epic.       Delivery Scheduled: Yes, Expected medication delivery date: 11/16/22.     Medication will be delivered via UPS to the prescription address in Epic WAM.    Sherral Hammers, PharmD   [facility] Specialty Pharmacist

## 2022-11-15 MED FILL — TREMFYA 100 MG/ML SUBCUTANEOUS AUTO-INJECTOR: 56 days supply | Qty: 1 | Fill #1

## 2022-11-21 ENCOUNTER — Encounter (INDEPENDENT_AMBULATORY_CARE_PROVIDER_SITE_OTHER): Payer: Self-pay

## 2022-12-17 ENCOUNTER — Ambulatory Visit (INDEPENDENT_AMBULATORY_CARE_PROVIDER_SITE_OTHER): Payer: BC Managed Care – PPO | Admitting: Family Medicine

## 2022-12-17 ENCOUNTER — Encounter: Payer: Self-pay | Admitting: Family Medicine

## 2022-12-17 VITALS — BP 118/78 | HR 75 | Temp 98.5°F | Ht 66.0 in | Wt 190.6 lb

## 2022-12-17 DIAGNOSIS — Z23 Encounter for immunization: Secondary | ICD-10-CM | POA: Diagnosis not present

## 2022-12-17 DIAGNOSIS — R200 Anesthesia of skin: Secondary | ICD-10-CM

## 2022-12-17 DIAGNOSIS — R7989 Other specified abnormal findings of blood chemistry: Secondary | ICD-10-CM

## 2022-12-17 DIAGNOSIS — F17201 Nicotine dependence, unspecified, in remission: Secondary | ICD-10-CM

## 2022-12-17 DIAGNOSIS — J40 Bronchitis, not specified as acute or chronic: Secondary | ICD-10-CM | POA: Diagnosis not present

## 2022-12-17 DIAGNOSIS — L409 Psoriasis, unspecified: Secondary | ICD-10-CM

## 2022-12-17 DIAGNOSIS — Z0000 Encounter for general adult medical examination without abnormal findings: Secondary | ICD-10-CM | POA: Diagnosis not present

## 2022-12-17 DIAGNOSIS — Z1159 Encounter for screening for other viral diseases: Secondary | ICD-10-CM | POA: Diagnosis not present

## 2022-12-17 MED ORDER — DOXYCYCLINE HYCLATE 100 MG PO TABS: 100.0000 mg | ORAL_TABLET | Freq: Two times a day (BID) | ORAL | 0 refills | Status: DC

## 2022-12-17 NOTE — Progress Notes (Signed)
[facility]IMARY Trecia Rogers [facility] [address] [place] [address] Dept: [phone] Dept Fax: [phone]  Annual Physical Visit  Subjective:    Patient ID: Sara Collins, female    DOB: 1971-01-03, 52 y.o..   MRN: [id number]  Chief Complaint  Patient presents with   Annual Exam    CPE/labs.  Not fasting.   C/o having ST, Swollen lymph nodes, cough x 2 weeks.  Has taken Mucinex.    History of Present Illness:  Patient is in today for an annual physical/preventative visit.  Sara Collins has a history of severe psoriasis that had covered 85% of her body. She is managed on Tremfya (guselkumab) with dramatic clearing of her skin. She continues to follow with Dr. Leonie Man (dermatology).  Sara Collins has a history of tobacco use. She notes that she quit smoking about a month ago. She was no longer enjoying smoking and worried this contributed to her respiratory infections.  Review of Systems  Constitutional:  Negative for chills, diaphoresis, fever, malaise/fatigue and weight loss.  HENT:  Positive for sore throat. Negative for congestion, ear pain, hearing loss, sinus pain and tinnitus.   Eyes:  Negative for blurred vision, pain, discharge and redness.  Respiratory:  Positive for cough. Negative for shortness of breath and wheezing.        Patient complains of a 2-week history of cough, sore throat, which has shifted from the left to the right. The cough is productive of phlegm. It is worse early in the morning.  Cardiovascular:  Negative for chest pain and palpitations.  Gastrointestinal:  Negative for abdominal pain, constipation, diarrhea, heartburn, nausea and vomiting.  Musculoskeletal:  Negative for back pain, joint pain and myalgias.  Skin:  Negative for itching and rash.  Neurological:  Positive for sensory change.       Notes numbness in both feet across the sole at the ball of the foot. This is intermittent. She denies standing  for long periods on her job.  Psychiatric/Behavioral:  Negative for depression. The patient is not nervous/anxious.    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 fib   Outpatient Medications Prior to Visit  Medication Sig Dispense Refill   acetaminophen (TYLENOL 8 HOUR) 650 MG CR tablet Take 1 tablet (650 mg total) by mouth every 8 (eight) hours. 30 tablet 0   Guselkumab (TREMFYA [place]) Inject 100 mg into the skin every 8 (eight) weeks.     No facility-administered medications prior to visit.   Allergies  Allergen Reactions   Sulfa Antibiotics Anaphylaxis   Objective:   Today's Vitals   12/17/22 1546  BP: 118/78  Pulse: 75  Temp: 98.5 F (36.9 C)  TempSrc: Temporal  SpO2: 98%  Weight: 190 lb 9.6 oz (86.5 kg)  Height: 5\' 6"  (1.676 m)   Body mass index is 30.76 kg/m.   General: Well developed, well nourished. No acute distress. HEENT: Normocephalic, non-traumatic. PERRL, EOMI. Conjunctiva clear. External ears normal. EAC and TMs   normal bilaterally. Nose  clear without congestion or rhinorrhea. Mucous membranes moist. Oropharynx clear. Good   dentition. Neck: Supple. No lymphadenopathy. No thyromegaly. Lungs: Clear to auscultation bilaterally. No wheezing, rales or rhonchi. CV: RRR without murmurs or rubs. Pulses 2+ bilaterally. Abdomen: Soft, non-tender. Bowel sounds positive, normal pitch and frequency. No hepatosplenomegaly. No   rebound or guarding. Extremities: Full ROM. No joint swelling or tenderness. No edema noted. Skin: Warm and dry. No rashes. Psych: Alert and oriented.  Normal mood and affect.  Health Maintenance Due  Topic Date Due   HIV Screening  Never done   Hepatitis C Screening  Never done   Colonoscopy  Never done   Zoster Vaccines- Shingrix (1 of 2) Never done   DTaP/Tdap/Td (3 - Td or Tdap) 10/02/2022     Assessment & Plan:   Problem List Items Addressed This Visit       Musculoskeletal and Integument   Psoriasis    Well managed on guselkumab (Tremfya). Continue to follow with Dr. Leonie Man.        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 Encouraged ongoing abstinence.      Other Visit Diagnoses     Annual physical exam    -  Primary   Overall good health. Reviewed recommended screenings and immunizations.   Bronchitis       Symptoms consistent iwht bronchitis. In light of her smokign history and immunosuppressnat therapy, I will treat with a course of doxycycline.   Relevant Medications   doxycycline (VIBRA-TABS) 100 MG tablet   Numbness in feet       Etiology unclear. I will do labs to screen for potential etiologies.   Relevant Orders   Comprehensive metabolic panel   TSH   VITAMIN D 25 Hydroxy (Vit-D Deficiency, Fractures)   Vitamin B12   Folate   HCV Ab w Reflex to Quant PCR   Need for Td vaccine       Relevant Orders   Td vaccine greater than or equal to 7yo preservative free IM (Completed)   Encounter for hepatitis C screening test for low risk patient       Relevant Orders   HCV Ab w Reflex to Quant PCR       Return in about 1 year (around 12/17/2023) for Annual preventative care.   Loyola Mast, MD

## 2022-12-17 NOTE — Assessment & Plan Note (Signed)
Well managed on guselkumab (Tremfya). Continue to follow with Dr. Leonie Man.

## 2022-12-17 NOTE — Assessment & Plan Note (Signed)
Congratulated her success in stopping smoking. Encouraged ongoing abstinence.

## 2022-12-18 DIAGNOSIS — R7989 Other specified abnormal findings of blood chemistry: Secondary | ICD-10-CM | POA: Insufficient documentation

## 2022-12-18 DIAGNOSIS — E038 Other specified hypothyroidism: Secondary | ICD-10-CM | POA: Insufficient documentation

## 2022-12-18 LAB — VITAMIN D 25 HYDROXY (VIT D DEFICIENCY, FRACTURES): VITD: 66.24 ng/mL (ref 30.00–100.00)

## 2022-12-18 LAB — COMPREHENSIVE METABOLIC PANEL
ALT: 15 U/L (ref 0–35)
AST: 34 U/L (ref 0–37)
Albumin: 4.1 g/dL (ref 3.5–5.2)
Alkaline Phosphatase: 88 U/L (ref 39–117)
BUN: 7 mg/dL (ref 6–23)
CO2: 25 meq/L (ref 19–32)
Calcium: 9.3 mg/dL (ref 8.4–10.5)
Chloride: 94 meq/L — ABNORMAL LOW (ref 96–112)
Creatinine, Ser: 0.49 mg/dL (ref 0.40–1.20)
GFR: 109.51 mL/min (ref >60.00)
Glucose, Bld: 103 mg/dL — ABNORMAL HIGH (ref 70–99)
Potassium: 4 mEq/L (ref 3.5–5.1)
Sodium: 133 meq/L — ABNORMAL LOW (ref 135–145)
Total Bilirubin: 1.5 mg/dL — ABNORMAL HIGH (ref 0.2–1.2)
Total Protein: 7.5 g/dL (ref 6.0–8.3)

## 2022-12-18 LAB — VITAMIN B12: Vitamin B-12: 367 pg/mL (ref 211–911)

## 2022-12-18 LAB — HCV INTERPRETATION

## 2022-12-18 LAB — TSH: TSH: 13.03 u[IU]/mL — ABNORMAL HIGH (ref 0.35–5.50)

## 2022-12-18 LAB — FOLATE: Folate: >24.2 ng/mL (ref >5.9)

## 2022-12-18 LAB — HCV AB W REFLEX TO QUANT PCR: HCV Ab: NONREACTIVE

## 2022-12-18 NOTE — Addendum Note (Signed)
Addended by: Loyola Mast on: 12/18/2022 03:05 PM   Modules accepted: Orders

## 2022-12-31 ENCOUNTER — Other Ambulatory Visit (INDEPENDENT_AMBULATORY_CARE_PROVIDER_SITE_OTHER): Payer: BC Managed Care – PPO

## 2022-12-31 DIAGNOSIS — R7989 Other specified abnormal findings of blood chemistry: Secondary | ICD-10-CM | POA: Diagnosis not present

## 2022-12-31 DIAGNOSIS — R946 Abnormal results of thyroid function studies: Secondary | ICD-10-CM | POA: Diagnosis not present

## 2023-01-01 ENCOUNTER — Encounter: Payer: Self-pay | Admitting: Family Medicine

## 2023-01-01 LAB — T4, FREE: Free T4: 0.82 ng/dL (ref 0.60–1.60)

## 2023-01-01 LAB — TSH: TSH: 6.83 u[IU]/mL — ABNORMAL HIGH (ref 0.35–5.50)

## 2023-01-08 NOTE — Unmapped (Signed)
[facility] Refill Coordination Note    Specialty Medication(s) to be Shipped:   Inflammatory Disorders: Tremfya    Other medication(s) to be shipped: No additional medications requested for fill at this time     Nina Vargas, DOB: 09/12/1970  Phone: [phone] (home)       All above HIPAA information was verified with patient.     Was a Nurse, learning disability used for this call? No    Completed refill call assessment today to schedule patient's medication shipment from the [facility] [phone]).  All relevant notes have been reviewed.     Specialty medication(s) and dose(s) confirmed: Regimen is correct and unchanged.   Changes to medications: Stepfanie reports no changes at this time.  Changes to insurance: No  New side effects reported not previously addressed with a pharmacist or physician: None reported  Questions for the pharmacist: No    Confirmed patient received a Conservation officer, historic buildings and a Surveyor, mining with first shipment. The patient will receive a drug information handout for each medication shipped and additional FDA Medication Guides as required.       DISEASE/MEDICATION-SPECIFIC INFORMATION        For patients on injectable medications: Patient currently has 0 doses left.  Next injection is scheduled for this weekend.    SPECIALTY MEDICATION ADHERENCE     Medication Adherence    Patient reported X missed doses in the last month: 0  Specialty Medication: TREMFYA 100 mg/mL [name] (guselkumab)  Patient is on additional specialty medications: No              Were doses missed due to medication being on hold? No    Tremfya 100 mg/ml: 4 days of medicine on hand       REFERRAL TO PHARMACIST     Referral to the pharmacist: Not needed      [facility]     Shipping address confirmed in Epic.       Delivery Scheduled: Yes, Expected medication delivery date: 01/10/23.     Medication will be delivered via UPS to the prescription address in Epic WAM.    Gaspar Cola Shared [facility] Specialty Technician

## 2023-01-09 MED FILL — TREMFYA 100 MG/ML SUBCUTANEOUS AUTO-INJECTOR: 56 days supply | Qty: 1 | Fill #2

## 2023-03-05 NOTE — Unmapped (Signed)
[facility] Refill Coordination Note    Specialty Medication(s) to be Shipped:   Inflammatory Disorders: Tremfya    Other medication(s) to be shipped: No additional medications requested for fill at this time     Nina Vargas, DOB: 1971/03/05  Phone: [phone] (home)       All above HIPAA information was verified with patient.     Was a Nurse, learning disability used for this call? No    Completed refill call assessment today to schedule patient's medication shipment from the [facility] [phone]).  All relevant notes have been reviewed.     Specialty medication(s) and dose(s) confirmed: Regimen is correct and unchanged.   Changes to medications: Maddie reports no changes at this time.  Changes to insurance: No  New side effects reported not previously addressed with a pharmacist or physician: None reported  Questions for the pharmacist: No    Confirmed patient received a Conservation officer, historic buildings and a Surveyor, mining with first shipment. The patient will receive a drug information handout for each medication shipped and additional FDA Medication Guides as required.       DISEASE/MEDICATION-SPECIFIC INFORMATION        For patients on injectable medications: Patient currently has 0 doses left.  Next injection is scheduled for this weekend.    SPECIALTY MEDICATION ADHERENCE     Medication Adherence    Patient reported X missed doses in the last month: 0  Specialty Medication: TREMFYA 100 mg/mL [name] (guselkumab)  Patient is on additional specialty medications: No              Were doses missed due to medication being on hold? No    Tremfya 100 mg/ml: 0 doses of medicine on hand       REFERRAL TO PHARMACIST     Referral to the pharmacist: Not needed      [facility]     Shipping address confirmed in Epic.       Delivery Scheduled: Yes, Expected medication delivery date: 03/07/23.     Medication will be delivered via UPS to the prescription address in Epic WAM.    Gaspar Cola Shared [facility] Specialty Technician

## 2023-03-06 MED FILL — TREMFYA 100 MG/ML SUBCUTANEOUS AUTO-INJECTOR: 56 days supply | Qty: 1 | Fill #3

## 2023-04-27 NOTE — Unmapped (Signed)
[facility] Refill Coordination Note    Specialty Medication(s) to be Shipped:   Inflammatory Disorders: Tremfya    Other medication(s) to be shipped: No additional medications requested for fill at this time     Nina Vargas, DOB: 1970-12-21  Phone: [phone] (home)       All above HIPAA information was verified with patient.     Was a Nurse, learning disability used for this call? No    Completed refill call assessment today to schedule patient's medication shipment from the [facility] [phone]).  All relevant notes have been reviewed.     Specialty medication(s) and dose(s) confirmed: Regimen is correct and unchanged.   Changes to medications: Nina Vargas reports no changes at this time.  Changes to insurance: No  New side effects reported not previously addressed with a pharmacist or physician: None reported  Questions for the pharmacist: No    Confirmed patient received a Conservation officer, historic buildings and a Surveyor, mining with first shipment. The patient will receive a drug information handout for each medication shipped and additional FDA Medication Guides as required.       DISEASE/MEDICATION-SPECIFIC INFORMATION        For patients on injectable medications: Patient currently has 0 doses left.  Next injection is scheduled for tuesday.    SPECIALTY MEDICATION ADHERENCE     Medication Adherence    Patient reported X missed doses in the last month: 0  Specialty Medication: TREMFYA 100 mg/mL [name] (guselkumab)  Patient is on additional specialty medications: No              Were doses missed due to medication being on hold? No    Tremfya 100 mg/ml: 0 doses of medicine on hand       REFERRAL TO PHARMACIST     Referral to the pharmacist: Not needed      [facility]     Shipping address confirmed in Epic.       Delivery Scheduled: Yes, Expected medication delivery date: 05/01/23.     Medication will be delivered via UPS to the prescription address in Epic WAM.    Gaspar Cola Shared [facility] Specialty Technician

## 2023-04-30 MED FILL — TREMFYA 100 MG/ML SUBCUTANEOUS AUTO-INJECTOR: 56 days supply | Qty: 1 | Fill #4

## 2023-05-02 DIAGNOSIS — Z79899 Other long term (current) drug therapy: Secondary | ICD-10-CM | POA: Diagnosis not present

## 2023-05-02 DIAGNOSIS — L400 Psoriasis vulgaris: Secondary | ICD-10-CM | POA: Diagnosis not present

## 2023-05-06 DIAGNOSIS — Z111 Encounter for screening for respiratory tuberculosis: Secondary | ICD-10-CM | POA: Diagnosis not present

## 2023-05-25 ENCOUNTER — Other Ambulatory Visit: Payer: Self-pay | Admitting: Family Medicine

## 2023-05-25 DIAGNOSIS — Z1231 Encounter for screening mammogram for malignant neoplasm of breast: Secondary | ICD-10-CM

## 2023-05-30 ENCOUNTER — Ambulatory Visit
Admission: RE | Admit: 2023-05-30 | Discharge: 2023-05-30 | Disposition: A | Discharge status: Home / Self Care | Payer: BC Managed Care – PPO | Source: Ambulatory Visit

## 2023-05-30 DIAGNOSIS — Z1231 Encounter for screening mammogram for malignant neoplasm of breast: Secondary | ICD-10-CM

## 2023-06-19 MED ORDER — TREMFYA 100 MG/ML SUBCUTANEOUS AUTO-INJECTOR
SUBCUTANEOUS | 5 refills | 56.00 days
Start: 2023-06-19 — End: ?

## 2023-06-19 MED ORDER — TREMFYA ONE-PRESS 100 MG/ML SUBCUTANEOUS AUTO-INJECTOR
SUBCUTANEOUS | 0 refills | 56.00000 days
Start: 2023-06-19 — End: ?

## 2023-06-19 NOTE — Unmapped (Signed)
 [place] [facility] Clinical Assessment & Refill Coordination Note    Patient reports doing well on the medication and did not have any questions or concerns at this time.     Nina Vargas, DOB: Aug 07, 1970  Phone: [phone] (home)     All above HIPAA information was verified with patient.     Was a Nurse, learning disability used for this call? No    Specialty Medication(s):   Inflammatory Disorders: Tremfya     Current Outpatient Medications   Medication Sig Dispense Refill    empty container (SHARPS-A-GATOR DISPOSAL SYSTEM) Misc use as directed 1 each 2    guselkumab (TREMFYA) 100 mg/mL [name] Inject the contents of 1 auto-injector (100 mg total) under the skin every 8 weeks. 1 mL 4    guselkumab (TREMFYA) 100 mg/mL [name] Inject the contents of 1 pen (100 mg) under the skin every 8 weeks as maintenance 1 mL 4    loratadine (CLARITIN) 10 mg tablet Take 1 tablet (10 mg total) by mouth daily.       No current facility-administered medications for this visit.        Changes to medications: Makayela reports no changes at this time.    Medication list has been reviewed and updated in Epic: Yes    Allergies   Allergen Reactions    Sulfa (Sulfonamide Antibiotics) Anaphylaxis       Changes to allergies: No    Allergies have been reviewed and updated in Epic: Yes    SPECIALTY MEDICATION ADHERENCE     Tremfya  100 mg/ml: 0 doses of medicine on hand   Medication Adherence    Patient reported X missed doses in the last month: 0  Specialty Medication: guselkumab (TREMFYA) 100 mg/mL [name]  Patient is on additional specialty medications: No  Patient is on more than two specialty medications: No  Informant: patient          Specialty medication(s) dose(s) confirmed: Regimen is correct and unchanged.     Are there any concerns with adherence? No    Adherence counseling provided? Not needed    CLINICAL MANAGEMENT AND INTERVENTION      Clinical Benefit Assessment:    Do you feel the medicine is effective or helping your condition? Yes    Clinical Benefit counseling provided? Not needed    Adverse Effects Assessment:    Are you experiencing any side effects? No    Are you experiencing difficulty administering your medicine? No    Quality of Life Assessment:    Quality of Life    Rheumatology  Oncology  Dermatology  1. What impact has your specialty medication had on the symptoms of your skin condition (i.e. itchiness, soreness, stinging)?: Tremendous  2. What impact has your specialty medication had on your comfort level with your skin?: Tremendous  Cystic Fibrosis          How many days over the past month did your psoriasis  keep you from your normal activities? For example, brushing your teeth or getting up in the morning. 0    Have you discussed this with your provider? Not needed    Acute Infection Status:    Acute infections noted within Epic:  No active infections    Patient reported infection: None    Therapy Appropriateness:    Is therapy appropriate based on current medication list, adverse reactions, adherence, clinical benefit and progress toward achieving therapeutic goals? Yes, therapy is appropriate and should be continued  Clinical Intervention:    Was an intervention completed as part of this clinical assessment? No    DISEASE/MEDICATION-SPECIFIC INFORMATION      For patients on injectable medications: Patient currently has 0 doses left.  Next injection is scheduled for 06/29/23.    Chronic Inflammatory Diseases: Have you experienced any flares in the last month? No    PATIENT SPECIFIC NEEDS     Does the patient have any physical, cognitive, or cultural barriers? No    Is the patient high risk? No    Does the patient require physician intervention or other additional services (i.e., nutrition, smoking cessation, social work)? No    Does the patient have an additional or emergency contact listed in their chart? Yes    SOCIAL DETERMINANTS OF HEALTH     At the [facility], we have learned that life circumstances - like trouble affording food, housing, utilities, or transportation can affect the health of many of our patients.   That is why we wanted to ask: are you currently experiencing any life circumstances that are negatively impacting your health and/or quality of life? Patient declined to answer    Social Drivers of Health     Food Insecurity: Not on file   Internet Connectivity: Not on file   Housing/Utilities: Not on file   Tobacco Use: Not on file   Transportation Needs: Not on file   Alcohol Use: Not on file   Interpersonal Safety: Not on file   Physical Activity: Not on file   Intimate Partner Violence: Not on file   Stress: Not on file   Substance Use: Not on file (06/19/2023)   Social Connections: Not on file   Financial Resource Strain: Not on file   Depression: Not on file   Health Literacy: Not on file       Would you be willing to receive help with any of the needs that you have identified today? Not applicable       SHIPPING     Specialty Medication(s) to be Shipped:   Inflammatory Disorders: Tremfya    Other medication(s) to be shipped: No additional medications requested for fill at this time     Changes to insurance: No    Delivery Scheduled: Yes, Expected medication delivery date: 06/25/23.  However, Rx request for refills was sent to the provider as there are none remaining.     Medication will be delivered via UPS to the confirmed prescription address in [facility].    The patient will receive a drug information handout for each medication shipped and additional FDA Medication Guides as required.  Verified that patient has previously received a Conservation officer, historic buildings and a Surveyor, mining.    The patient or caregiver noted above participated in the development of this care plan and knows that they can request review of or adjustments to the care plan at any time.      All of the patient's questions and concerns have been addressed.    Sherral Hammers, PharmD   [facility] Specialty Pharmacist

## 2023-06-24 MED FILL — TREMFYA 100 MG/ML SUBCUTANEOUS AUTO-INJECTOR: SUBCUTANEOUS | 56 days supply | Qty: 1 | Fill #0

## 2023-08-07 NOTE — Unmapped (Signed)
 [facility] and Home Delivery Pharmacy Refill Coordination Note    Specialty Medication(s) to be Shipped:   Inflammatory Disorders: Tremfya    Other medication(s) to be shipped: No additional medications requested for fill at this time     Nina Vargas, DOB: 01-31-1971  Phone: [phone] (home)       All above HIPAA information was verified with patient.     Was a Nurse, learning disability used for this call? No    Completed refill call assessment today to schedule patient's medication shipment from the [facility] Delivery Pharm[facility]  [phone]).  All relevant notes have been reviewed.     Specialty medication(s) and dose(s) confirmed: Regimen is correct and unchanged.   Changes to medications: [name] reports no changes at this time.  Changes to insurance: No  New side effects reported not previously addressed with a pharmacist or physician: None reported  Questions for the pharmacist: No    Confirmed patient received a Conservation officer, historic buildings and a Surveyor, mining with first shipment. The patient will receive a drug information handout for each medication shipped and additional FDA Medication Guides as required.       DISEASE/MEDICATION-SPECIFIC INFORMATION        For patients on injectable medications: Patient currently has 0 doses left.  Next injection is scheduled for 08/16/23.    SPECIALTY MEDICATION ADHERENCE     Medication Adherence    Patient reported X missed doses in the last month: 0  Specialty Medication: TREMFYA 100 mg/mL [name] (guselkumab)  Patient is on additional specialty medications: No  Patient is on more than two specialty medications: No  Any gaps in refill history greater than 2 weeks in the last 3 months: no  Demonstrates understanding of importance of adherence: yes              Were doses missed due to medication being on hold? No    TREMFYA 100   mg/ml: 0 days of medicine on hand       REFERRAL TO PHARMACIST     Referral to the pharmacist: Not needed      [facility]     Shipping address confirmed in Epic.     Cost and Payment: Patient has a $0 copay, payment information is not required.    Delivery Scheduled: Yes, Expected medication delivery date: 08/13/23.     Medication will be delivered via UPS to the prescription address in Epic WAM.    Stephen Ehrlich   [facility] and Home Delivery Pharm[facility]  Specialty Technician

## 2023-08-12 MED FILL — TREMFYA 100 MG/ML SUBCUTANEOUS AUTO-INJECTOR: SUBCUTANEOUS | 56 days supply | Qty: 1 | Fill #1

## 2023-10-07 NOTE — Unmapped (Signed)
 [facility] Specialty and Home Delivery Pharmacy Refill Coordination Note    Specialty Medication(s) to be Shipped:   Inflammatory Disorders: Tremfya     Other medication(s) to be shipped: No additional medications requested for fill at this time     Nina Vargas, DOB: Dec 09, 1970  Phone: [phone] (home)       All above HIPAA information was verified with patient.     Was a Nurse, learning disability used for this call? No    Completed refill call assessment today to schedule patient's medication shipment from the [facility] and [facility] Delivery Pharmacy  [phone]).  All relevant notes have been reviewed.     Specialty medication(s) and dose(s) confirmed: Regimen is correct and unchanged.   Changes to medications: [name] reports no changes at this time.  Changes to insurance: No  New side effects reported not previously addressed with a pharmacist or physician: None reported  Questions for the pharmacist: No    Confirmed patient received a Conservation officer, historic buildings and a Surveyor, mining with first shipment. The patient will receive a drug information handout for each medication shipped and additional FDA Medication Guides as required.       DISEASE/MEDICATION-SPECIFIC INFORMATION        For patients on injectable medications: Patient currently has 0 doses left.  Next injection is scheduled for 10/10/23.    SPECIALTY MEDICATION ADHERENCE     Medication Adherence    Patient reported X missed doses in the last month: 0  Specialty Medication: guselkumab : TREMFYA  100 mg/mL [name]  Patient is on additional specialty medications: No              Were doses missed due to medication being on hold? No    guselkumab : TREMFYA  100 mg/mL [name]: 0 doses of medicine on hand       REFERRAL TO PHARMACIST     Referral to the pharmacist: Not needed      SHIPPING     Shipping address confirmed in Epic.     Cost and Payment: Patient has a $0 copay, payment information is not required.    Delivery Scheduled: Yes, Expected medication delivery date: 10/10/23. Medication will be delivered via UPS to the prescription address in Epic WAM.    Arno Lapidus   [facility] Delivery Pharm[facility]  Specialty Technician

## 2023-10-09 MED FILL — TREMFYA 100 MG/ML SUBCUTANEOUS AUTO-INJECTOR: SUBCUTANEOUS | 56 days supply | Qty: 1 | Fill #2

## 2023-11-28 NOTE — Unmapped (Signed)
 [facility] Special[facility] and Home Delivery Pharmacy Refill Coordination Note    Specialty Medication(s) to be Shipped:   Inflammatory Disorders: Tremfya     Other medication(s) to be shipped: No additional medications requested for fill at this time     Nina Vargas, DOB: [birth year]  Phone: [phone] (home)       All above HIPAA information was verified with patient.     Was a Nurse, learning disability used for this call? No    Completed refill call assessment today to schedule patient's medication shipment from the [facility] and Home Delivery Pharmacy  [phone]).  All relevant notes have been reviewed.     Specialty medication(s) and dose(s) confirmed: Regimen is correct and unchanged.   Changes to medications: Azura reports no changes at this time.  Changes to insurance: No  New side effects reported not previously addressed with a pharmacist or physician: None reported  Questions for the pharmacist: No    Confirmed patient received a Conservation officer, historic buildings and a Surveyor, mining with first shipment. The patient will receive a drug information handout for each medication shipped and additional FDA Medication Guides as required.       DISEASE/MEDICATION-SPECIFIC INFORMATION        For patients on injectable medications: Patient currently has 0 doses left.  Next injection is scheduled for 12/05/2023.    SPECIALTY MEDICATION ADHERENCE     Medication Adherence    Patient reported X missed doses in the last month: 0  Specialty Medication: TREMFYA  100 mg/mL [name] (guselkumab )  Patient is on additional specialty medications: No              Were doses missed due to medication being on hold? No     TREMFYA  100 mg/mL [name] (guselkumab ): 0 doses of medicine on hand       REFERRAL TO PHARMACIST     Referral to the pharmacist: Not needed      [facility]     Shipping address confirmed in Epic.     Cost and Payment: Patient has a $0 copay, payment information is not required.    Delivery Scheduled: Yes, Expected medication delivery date: 12/03/2023.     Medication will be delivered via UPS to the prescription address in Epic WAM.    Lucie CHRISTELLA Forts   [facility] Special[facility] and [facility] Delivery Pharm[facility]  Specialty Technician

## 2023-12-02 MED FILL — TREMFYA 100 MG/ML SUBCUTANEOUS AUTO-INJECTOR: SUBCUTANEOUS | 56 days supply | Qty: 1 | Fill #3

## 2023-12-18 ENCOUNTER — Encounter: Payer: BC Managed Care – PPO | Admitting: Family Medicine

## 2023-12-30 IMAGING — MG MM DIGITAL SCREENING BILAT W/ TOMO AND CAD
8 series · 8 of 24 positions shown · non-contrast
Comparison: None.

CLINICAL DATA: Screening.

EXAM:
DIGITAL SCREENING BILATERAL MAMMOGRAM WITH TOMOSYNTHESIS AND CAD
TECHNIQUE: Bilateral screening digital craniocaudal and mediolateral oblique
mammograms were obtained. Bilateral screening digital breast
tomosynthesis was performed. The images were evaluated with
computer-aided detection.

[L CC synth-2D]
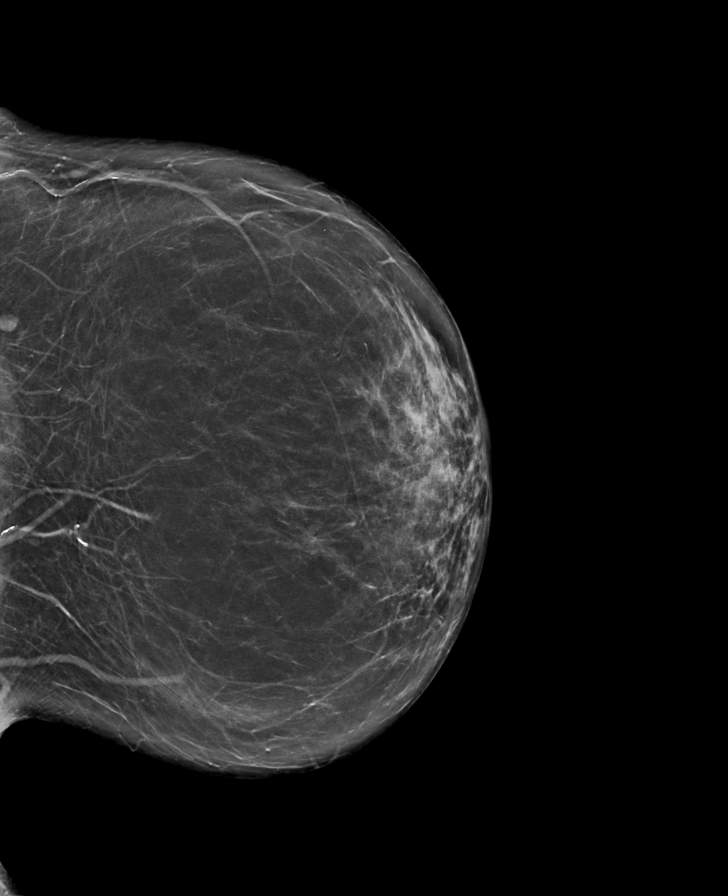

[R CC synth-2D]
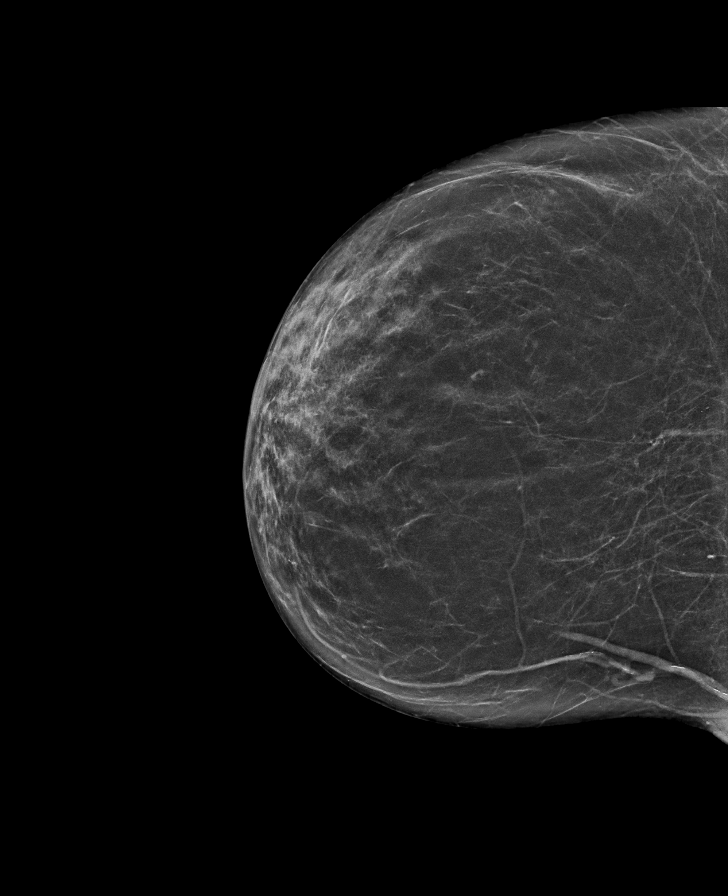

[L MLO synth-2D]
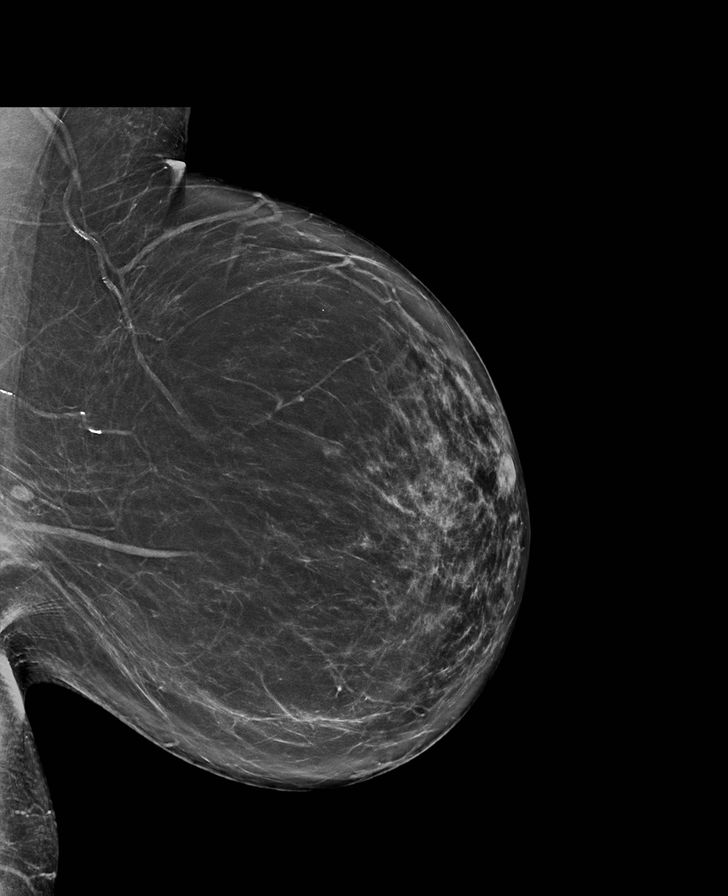

[R MLO synth-2D]
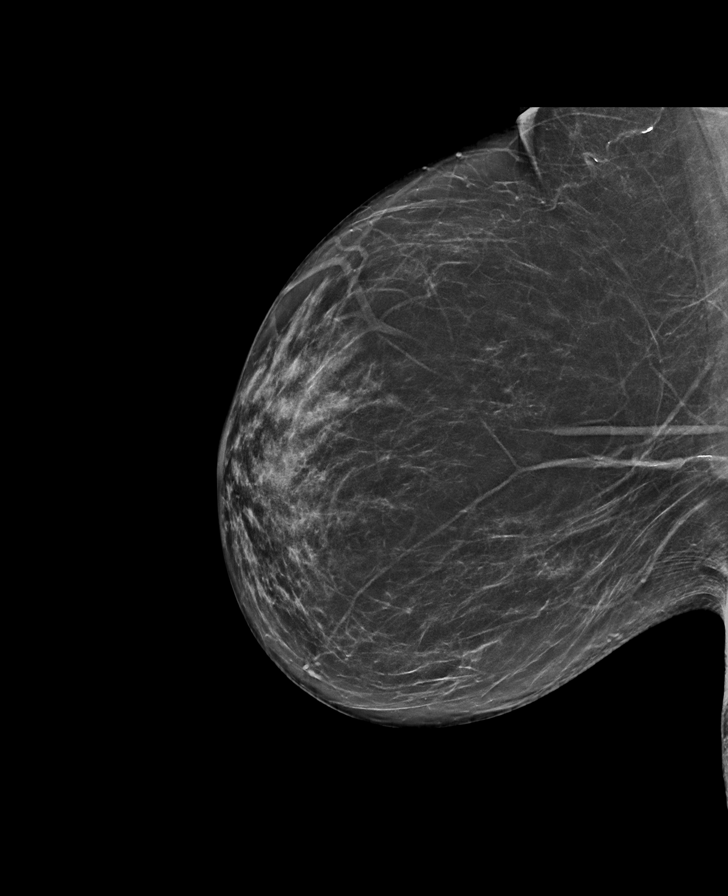

[L CC tomo · tomo slice 37/72.0]
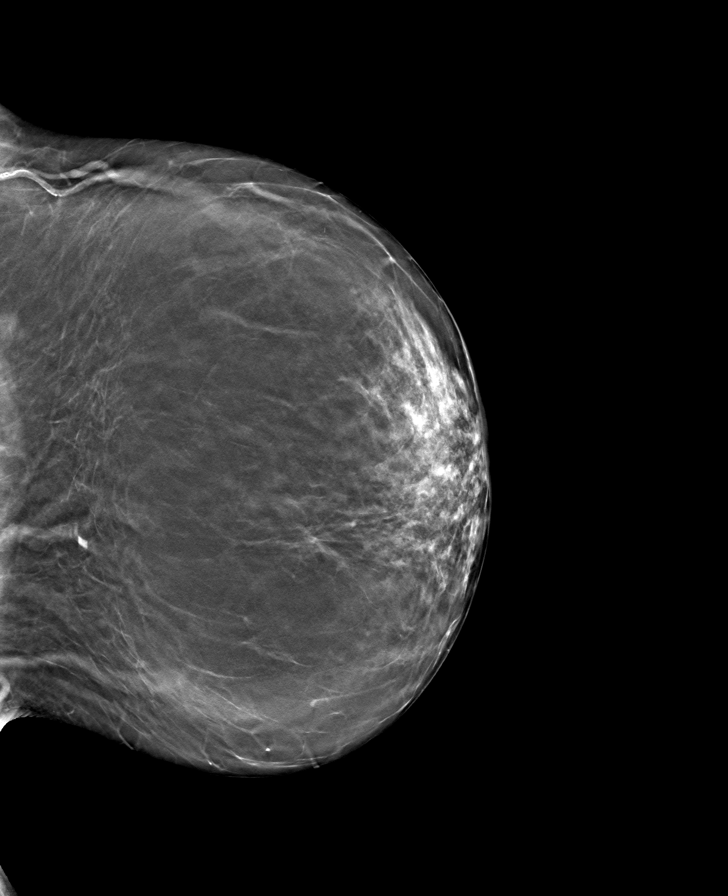

[L MLO tomo · tomo slice 38/75.0]
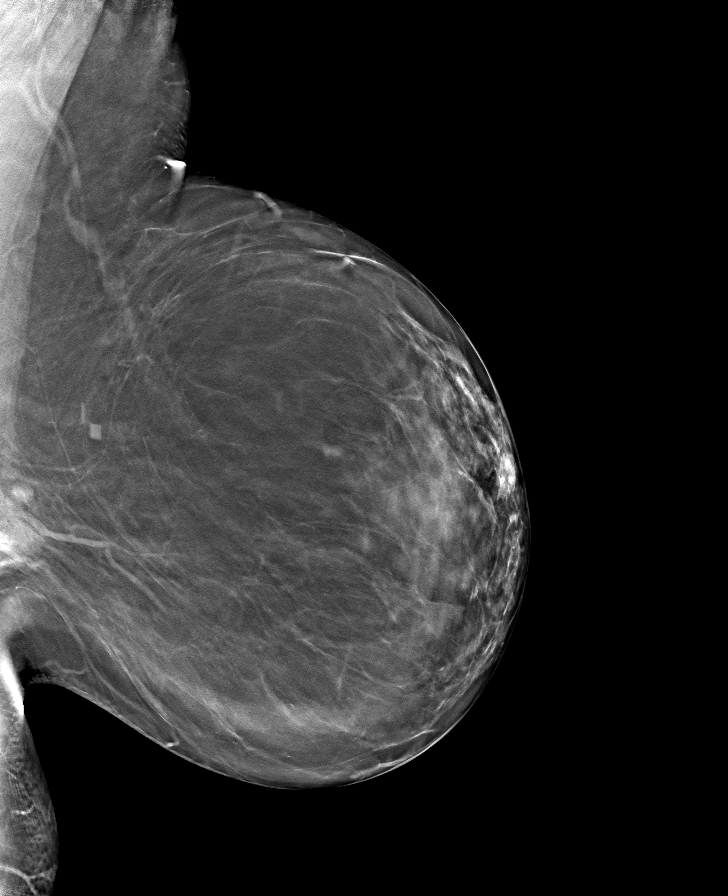

[R CC tomo · tomo slice 33/66.0]
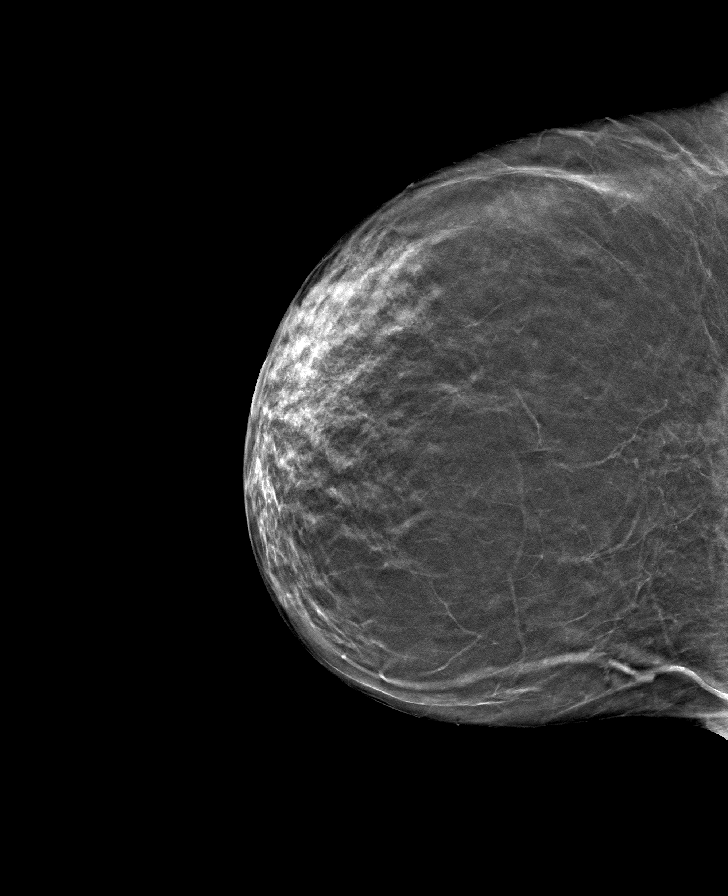

[R MLO tomo · tomo slice 37/73.0]
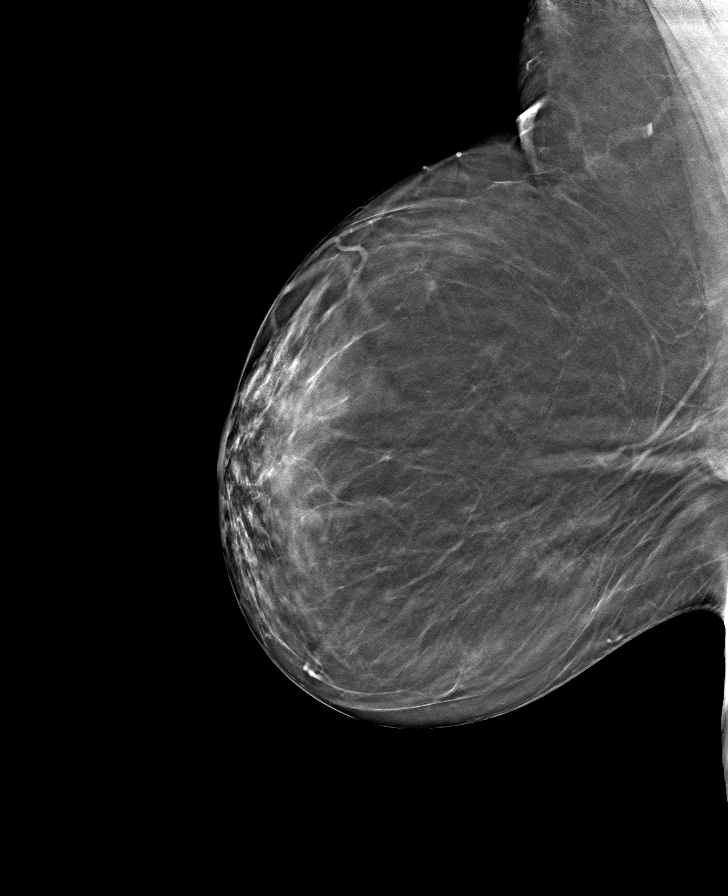

[8 of 24 positions shown; findings below may reference images not displayed]

ACR Breast Density Category b: There are scattered areas of
fibroglandular density.
FINDINGS: There are no findings suspicious for malignancy.
IMPRESSION: No mammographic evidence of malignancy. A result letter of this
screening mammogram will be mailed directly to the patient.

RECOMMENDATION:
Screening mammogram in one year. (Code:XG-X-X7B)

BI-RADS CATEGORY  1: Negative.

## 2024-01-24 NOTE — Unmapped (Signed)
 [facility] Special[facility] and Home Delivery Pharmacy Refill Coordination Note    Specialty Medication(s) to be Shipped:   Inflammatory Disorders: Tremfya     Other medication(s) to be shipped: No additional medications requested for fill at this time    Specialty Medications not needed at this time: N/A     Nina Vargas, DOB: [birth year]  Phone: There are no phone numbers on file.      All above HIPAA information was verified with patient.     Was a Nurse, learning disability used for this call? No    Completed refill call assessment today to schedule patient's medication shipment from the [facility] Delivery Pharmacy  [phone]).  All relevant notes have been reviewed.     Specialty medication(s) and dose(s) confirmed: Regimen is correct and unchanged.   Changes to medications: Nina Vargas reports no changes at this time.  Changes to insurance: No  New side effects reported not previously addressed with a pharmacist or physician: None reported  Questions for the pharmacist: No    Confirmed patient received a Conservation officer, historic buildings and a Surveyor, mining with first shipment. The patient will receive a drug information handout for each medication shipped and additional FDA Medication Guides as required.       DISEASE/MEDICATION-SPECIFIC INFORMATION        For patients on injectable medications: Next injection is scheduled for 10/07.    SPECIALTY MEDICATION ADHERENCE     Medication Adherence    Patient reported X missed doses in the last month: 0  Specialty Medication: tremfya  100mg /ml  Patient is on additional specialty medications: No  Patient is on more than two specialty medications: No  Any gaps in refill history greater than 2 weeks in the last 3 months: no  Demonstrates understanding of importance of adherence: yes  Informant: patient  Reliability of informant: reliable  Provider-estimated medication adherence level: good  Patient is at risk for Non-Adherence: No  Reasons for non-adherence: no problems identified  Confirmed plan for next specialty medication refill: delivery by pharmacy  Refills needed for supportive medications: not needed          Refill Coordination    Has the Patients' Contact Information Changed: No  Is the Shipping Address Different: No         Were doses missed due to medication being on hold? No    tremfya  100 mg/ml: 0 days of medicine on hand       REFERRAL TO PHARMACIST     Referral to the pharmacist: Not needed      [facility]     Shipping address confirmed in Epic.     Cost and Payment: Patient has a $0 copay, payment information is not required.    Delivery Scheduled: Yes, Expected medication delivery date: 10/07.     Medication will be delivered via UPS to the prescription address in Epic WAM.    [facility]ina Vargas [facility] and Home Delivery Pharmacy  Specialty Technician

## 2024-01-26 ENCOUNTER — Other Ambulatory Visit: Payer: Self-pay

## 2024-01-26 ENCOUNTER — Emergency Department (HOSPITAL_BASED_OUTPATIENT_CLINIC_OR_DEPARTMENT_OTHER)

## 2024-01-26 ENCOUNTER — Encounter (HOSPITAL_BASED_OUTPATIENT_CLINIC_OR_DEPARTMENT_OTHER): Payer: Self-pay

## 2024-01-26 ENCOUNTER — Emergency Department (HOSPITAL_BASED_OUTPATIENT_CLINIC_OR_DEPARTMENT_OTHER)
Admission: EM | Admit: 2024-01-26 | Discharge: 2024-01-26 | Disposition: A | Discharge status: Home / Self Care | Attending: Emergency Medicine | Admitting: Emergency Medicine

## 2024-01-26 DIAGNOSIS — K7460 Unspecified cirrhosis of liver: Secondary | ICD-10-CM | POA: Insufficient documentation

## 2024-01-26 DIAGNOSIS — R188 Other ascites: Secondary | ICD-10-CM

## 2024-01-26 DIAGNOSIS — K766 Portal hypertension: Secondary | ICD-10-CM | POA: Diagnosis not present

## 2024-01-26 DIAGNOSIS — R109 Unspecified abdominal pain: Secondary | ICD-10-CM | POA: Diagnosis not present

## 2024-01-26 DIAGNOSIS — K56609 Unspecified intestinal obstruction, unspecified as to partial versus complete obstruction: Secondary | ICD-10-CM | POA: Diagnosis not present

## 2024-01-26 DIAGNOSIS — K8689 Other specified diseases of pancreas: Secondary | ICD-10-CM | POA: Diagnosis not present

## 2024-01-26 LAB — CBC
HCT: 37.9 % (ref 36.0–46.0)
Hemoglobin: 13.2 g/dL (ref 12.0–15.0)
MCH: 39.3 pg — ABNORMAL HIGH (ref 26.0–34.0)
MCHC: 34.8 g/dL (ref 30.0–36.0)
MCV: 112.8 fL — ABNORMAL HIGH (ref 80.0–100.0)
Platelets: 84 K/uL — ABNORMAL LOW (ref 150–400)
RBC: 3.36 MIL/uL — ABNORMAL LOW (ref 3.87–5.11)
RDW: 13.3 % (ref 11.5–15.5)
WBC: 4.4 K/uL (ref 4.0–10.5)
nRBC: 0 % (ref 0.0–0.2)

## 2024-01-26 LAB — COMPREHENSIVE METABOLIC PANEL WITH GFR
ALT: 29 U/L (ref 0–44)
AST: 119 U/L — ABNORMAL HIGH (ref 15–41)
Albumin: 3.6 g/dL (ref 3.5–5.0)
Alkaline Phosphatase: 132 U/L — ABNORMAL HIGH (ref 38–126)
Anion gap: 13 (ref 5–15)
BUN: <5 mg/dL — ABNORMAL LOW (ref 6–20)
CO2: 25 mmol/L (ref 22–32)
Calcium: 9.4 mg/dL (ref 8.9–10.3)
Chloride: 96 mmol/L — ABNORMAL LOW (ref 98–111)
Creatinine, Ser: 0.45 mg/dL (ref 0.44–1.00)
GFR, Estimated: >60 mL/min (ref >60)
Glucose, Bld: 103 mg/dL — ABNORMAL HIGH (ref 70–99)
Potassium: 3.6 mmol/L (ref 3.5–5.1)
Sodium: 133 mmol/L — ABNORMAL LOW (ref 135–145)
Total Bilirubin: 4.4 mg/dL — ABNORMAL HIGH (ref 0.0–1.2)
Total Protein: 7.5 g/dL (ref 6.5–8.1)

## 2024-01-26 LAB — LIPASE, BLOOD: Lipase: 18 U/L (ref 11–51)

## 2024-01-26 LAB — ACETAMINOPHEN LEVEL: Acetaminophen (Tylenol), Serum: <10 ug/mL — ABNORMAL LOW (ref 10–30)

## 2024-01-26 MED ORDER — IOHEXOL 300 MG/ML  SOLN
100.0000 mL | Freq: Once | INTRAMUSCULAR | Status: AC | PRN
  Administered 2024-01-26: 100 mL via INTRAVENOUS

## 2024-01-26 NOTE — ED Provider Notes (Signed)
 [place] EMERGENCY DEPARTMENT AT [facility] Provider Note   CSN: [id number] Arrival date & time: 01/26/24  1327     Patient presents with: Abdominal Pain   Sara Collins is a 53 y.o. female.    Abdominal Pain Patient presents with abdominal pain.  Had URI symptoms couple weeks ago and last week developed stomach bug.  Had nausea vomiting diarrhea that resolved on Tuesday but has had more bloating since.  Still having bowel movements.  States she took some laxatives and did have large bowel movements but still abdominal distention.  No vomiting.  No dysuria.  States abdomen has continued to get bigger.  Previous surgeries in the abdomen for endometriosis. Patient also states she has been taking a fair amount of Tylenol .  Taking 2 pills of it every 4-6 hours while her abdomen is hurting.   Past Medical History:  Diagnosis Date   Allergy Sulfa based   Heart murmur Born with a murmur   No medication   Mitral valve prolapse    Pinched nerve    Sciatica    TIA (transient ischemic attack)    Past Surgical History:  Procedure Laterality Date   HYSTEROSALPINGOGRAM     LAPAROSCOPY WITH CO2 LASER     Endometriosis    Prior to Admission medications   Medication Sig Start Date End Date Taking? Authorizing Provider  acetaminophen  (TYLENOL  8 HOUR) 650 MG CR tablet Take 1 tablet (650 mg total) by mouth every 8 (eight) hours. 02/23/18   Charlyn Sora, MD  doxycycline  (VIBRA -TABS) 100 MG tablet Take 1 tablet (100 mg total) by mouth 2 (two) times daily. 12/17/22   Thedora Garnette HERO, MD  Guselkumab (TREMFYA [place]) Inject 100 mg into the skin every 8 (eight) weeks.    [provider]    Allergies: Sulfa antibiotics    Review of Systems  Gastrointestinal:  Positive for abdominal pain.    Updated Vital Signs BP (!) 123/91 (BP Location: Right Arm)   Pulse 86   Temp 98.3 F (36.8 C) (Oral)   Resp 20   Ht 5' 7 (1.702 m)   Wt 89.5 kg   LMP 11/15/2011   SpO2 100%    BMI 30.92 kg/m   Physical Exam Vitals and nursing note reviewed.  Cardiovascular:     Rate and Rhythm: Normal rate.  Abdominal:     Hernia: No hernia is present.     Comments: Mildly diffuse abdominal distention.  No rebound or guarding.  No hernia palpated.  Neurological:     Mental Status: She is alert.     (all labs ordered are listed, but only abnormal results are displayed) Labs Reviewed  COMPREHENSIVE METABOLIC PANEL WITH GFR - Abnormal; Notable for the following components:      Result Value   Sodium 133 (*)    Chloride 96 (*)    Glucose, Bld 103 (*)    BUN <5 (*)    AST 119 (*)    Alkaline Phosphatase 132 (*)    Total Bilirubin 4.4 (*)    All other components within normal limits  CBC - Abnormal; Notable for the following components:   RBC 3.36 (*)    MCV 112.8 (*)    MCH 39.3 (*)    Platelets 84 (*)    All other components within normal limits  ACETAMINOPHEN  LEVEL - Abnormal; Notable for the following components:   Acetaminophen  (Tylenol ), Serum <10 (*)    All other components within normal limits  LIPASE, BLOOD    EKG: None  Radiology: CT ABDOMEN PELVIS W CONTRAST Result Date: 01/26/2024 CLINICAL DATA:  Bowel obstruction suspected * Tracking Code: BO * EXAM: CT ABDOMEN AND PELVIS WITH CONTRAST TECHNIQUE: Multidetector CT imaging of the abdomen and pelvis was performed using the standard protocol following bolus administration of intravenous contrast. RADIATION DOSE REDUCTION: This exam was performed according to the departmental dose-optimization program which includes automated exposure control, adjustment of the mA and/or kV according to patient size and/or use of iterative reconstruction technique. CONTRAST:  OMNIPAQUE  IOHEXOL  300 MG/ML  SOLN COMPARISON:  None Available. FINDINGS: Lower chest: Bibasilar scarring or atelectasis with elevation of the right hemidiaphragm. Hepatobiliary: Coarse contour of the liver. Numerous hypodense nodular lesions  throughout the liver, particularly in the right lobe (series 2, image 29). No gallstones, gallbladder wall thickening, or biliary dilatation. Pancreas: Diffusely atrophic, calcified pancreas with diffuse pancreatic ductal dilatation. No pancreatic ductal dilatation or surrounding inflammatory changes. Spleen: Normal in size without significant abnormality. Adrenals/Urinary Tract: Adrenal glands are unremarkable. Kidneys are normal, without renal calculi, solid lesion, or hydronephrosis. Bladder is unremarkable. Stomach/Bowel: Stomach is within normal limits. Diffuse wall thickening of the cecum and ascending colon generally most consistent with portal colopathy. No bowel distention. Vascular/Lymphatic: Gastroesophageal varices. Recanalization of the umbilical vein with caput varices. No enlarged abdominal or pelvic lymph nodes. Reproductive: No mass or other significant abnormality. Other: No abdominal wall hernia or abnormality. Large volume ascites throughout the abdomen and pelvis. Musculoskeletal: No acute or significant osseous findings. IMPRESSION: 1. Coarse contour of the liver, consistent with cirrhosis. 2. Numerous hypodense nodular lesions throughout the liver, particularly in the right lobe. These are incompletely characterized on this single phase examination, and may reflect regenerative or dysplastic nodules, however malignancy or metastatic disease is not excluded. Consider multiphasic contrast enhanced MRI to further evaluate on a nonemergent, outpatient basis, preferably following paracentesis. 3. Stigmata of portal hypertension including large volume ascites, gastroesophageal varices, and recanalization of the umbilical vein with caput varices. 4. Diffuse wall thickening of the cecum and ascending colon generally most consistent with portal colopathy. No bowel distention or other evidence of obstruction. 5. Diffusely atrophic, calcified pancreas with diffuse pancreatic ductal dilatation, consistent  with chronic pancreatitis. Electronically Signed   By: Marolyn JONETTA Jaksch M.D.   On: 01/26/2024 16:34     Procedures   Medications Ordered in the ED  iohexol  (OMNIPAQUE ) 300 MG/ML solution 100 mL (100 mLs Intravenous Contrast Given 01/26/24 1615)                                    Medical Decision Making Amount and/or Complexity of Data Reviewed Labs: ordered. Radiology: ordered.  Risk Prescription drug management.   Patient with some abdominal bloating.  Some mild pain.  Recent URI symptoms and recent nausea vomiting diarrhea.  Differential diagnosis does include obstruction.  Also ileus.  Blood work does show mildly elevated LFTs.  Has been taking Tylenol .  Will get CT scan and add acetaminophen  level.  Bilirubin is elevated 4.4.  Discussed further patient.  Did have previous heavy drinking.  Also previous heavy use of Tylenol  although not really that recent.  Is on Tremfya which also can cause some liver issues or be worse with liver impairment.  Has some ascites.  I think patient benefit from outpatient follow-up but not think we necessarily admission at this time.  Will have follow-up with [facility].  Discussed with patient and will stop the Tremfya will stop alcohol and Tylenol .  Will discharge home.     Final diagnoses:  Cirrhosis of liver with ascites, unspecified hepatic cirrhosis type [facility])    ED Discharge Orders     None          Patsey Lot, MD 01/26/24 1701

## 2024-01-26 NOTE — ED Triage Notes (Signed)
 Pt reports having a stomach bug last weekend, sx of n/v/d resolving on Tuesday, but reports extreme bloating since Tuesday.  Pt has tried anti-gas medications with no relief. Pt denies urinary sx or constipation.

## 2024-01-26 NOTE — ED Notes (Signed)
 Dc instructions reviewed with patient. Patient voiced understanding. Dc with belongings.

## 2024-01-26 NOTE — ED Notes (Signed)
Dr pickering at bedside  

## 2024-01-26 NOTE — Discharge Instructions (Addendum)
 Follow-up with your doctor and gastroenterology.  Stop the Tremfya and talk to the providers about whether to continue it.

## 2024-01-27 ENCOUNTER — Encounter: Payer: Self-pay | Admitting: Physician Assistant

## 2024-01-27 MED FILL — TREMFYA 100 MG/ML SUBCUTANEOUS AUTO-INJECTOR: SUBCUTANEOUS | 56 days supply | Qty: 1 | Fill #4

## 2024-02-04 ENCOUNTER — Ambulatory Visit (INDEPENDENT_AMBULATORY_CARE_PROVIDER_SITE_OTHER): Admitting: Family Medicine

## 2024-02-04 VITALS — BP 110/70 | HR 68 | Temp 97.1°F | Ht 67.0 in | Wt 195.8 lb

## 2024-02-04 DIAGNOSIS — K861 Other chronic pancreatitis: Secondary | ICD-10-CM | POA: Insufficient documentation

## 2024-02-04 DIAGNOSIS — F17200 Nicotine dependence, unspecified, uncomplicated: Secondary | ICD-10-CM | POA: Insufficient documentation

## 2024-02-04 DIAGNOSIS — K7031 Alcoholic cirrhosis of liver with ascites: Secondary | ICD-10-CM

## 2024-02-04 DIAGNOSIS — G250 Essential tremor: Secondary | ICD-10-CM | POA: Insufficient documentation

## 2024-02-04 DIAGNOSIS — K860 Alcohol-induced chronic pancreatitis: Secondary | ICD-10-CM

## 2024-02-04 DIAGNOSIS — K760 Fatty (change of) liver, not elsewhere classified: Secondary | ICD-10-CM | POA: Insufficient documentation

## 2024-02-04 DIAGNOSIS — F411 Generalized anxiety disorder: Secondary | ICD-10-CM | POA: Insufficient documentation

## 2024-02-04 DIAGNOSIS — K7460 Unspecified cirrhosis of liver: Secondary | ICD-10-CM | POA: Insufficient documentation

## 2024-02-04 NOTE — Progress Notes (Signed)
 [facility] PRIMARY CARE [facility] [address] [place] [address] Dept: [phone] Dept Fax: [phone]  Office Visit  Subjective:    Patient ID: Sara Collins, female    DOB: 1970/07/12, 53 y.o..   MRN: [id number]  Chief Complaint  Patient presents with   Follow-up    F/u cirrhosis. Has appointment with GI on  03/13/24.     History of Present Illness:  Patient is in today for follow up of a recent ED visit where she was diagnosed with cirrhosis. Ms. Dragos had been noting a progressive enlargement of her abdomen over the past 2-3 weeks. She notes only minor discomfort from this. She denies any nausea or vomiting. she has not noted any melena. She is not noting swelling of the extremities and has not noted any jaundice. She did take an OTC diuretic, but couldn't tell it made any significant difference with her abdominal bloating.  Ms. Spillman notes that she has been drinking alcohol on a daily basis. During the week, she drinks 2 glasses of wine. however, on most weekends, she takes 3-4 shots of liquor and drinks wine throughout the day.  Since she started having her current symptoms, she has stopped drinking alcohol and feels like she is managing well without this. She has been on guselkumab [facility]) and is concerned this may have contributed to liver toxicity. She has stopped her Tremfya for the time being. She plans to use topical steroids for now. lastly, she had reinjured her left foot ankle a few weeks ago and had been taking higher doses of Tylenol .  Past Medical History: Patient Active Problem List   Diagnosis Date Noted   Steatosis of liver 02/04/2024   Nicotine dependence 02/04/2024   Generalized anxiety disorder 02/04/2024   Essential tremor 02/04/2024   Cirrhosis of liver (HCC) 02/04/2024   Chronic pancreatitis (HCC) 02/04/2024   Subclinical hypothyroidism 12/18/2022   Mild tobacco abuse in early remission 12/15/2021    Mitral valve prolapse 04/03/2021   Psoriasis 04/03/2021   Past Surgical History:  Procedure Laterality Date   HYSTEROSALPINGOGRAM     LAPAROSCOPY WITH CO2 LASER     Endometriosis   Family History  Problem Relation Age of Onset   Hyperlipidemia Mother    Stroke Father    Heart disease Father        A. fib   Outpatient Medications Prior to Visit  Medication Sig Dispense Refill   Multiple Vitamin (MULTIVITAMINS PO) Orally     acetaminophen  (TYLENOL  8 HOUR) 650 MG CR tablet Take 1 tablet (650 mg total) by mouth every 8 (eight) hours. (Patient not taking: Reported on 02/04/2024) 30 tablet 0   Guselkumab (TREMFYA [place]) Inject 100 mg into the skin every 8 (eight) weeks. (Patient not taking: Reported on 02/04/2024)     doxycycline  (VIBRA -TABS) 100 MG tablet Take 1 tablet (100 mg total) by mouth 2 (two) times daily. 20 tablet 0   No facility-administered medications prior to visit.   Allergies  Allergen Reactions   Sulfa Antibiotics Anaphylaxis     Objective:   Today's Vitals   02/04/24 1525  BP: 110/70  Pulse: 68  Temp: (!) 97.1 F (36.2 C)  TempSrc: Temporal  SpO2: 97%  Weight: 195 lb 12.8 oz (88.8 kg)  Height: 5' 7 (1.702 m)   Body mass index is 30.67 kg/m.   General: Well developed, well nourished. No acute distress. HEENT: Normocephalic, non-traumatic. PERRL, EOMI. Conjunctiva clear. No scleral icterus. External ears normal. EAC  and TMs normal   bilaterally. Nose clear without congestion or rhinorrhea. Mucous membranes moist. Oropharynx clear. Good dentition. Neck: Supple. No lymphadenopathy. No thyromegaly. Lungs: Clear to auscultation bilaterally. No wheezing, rales or rhonchi. CV: RRR without murmurs or rubs. Pulses 2+ bilaterally. Abdomen: Soft, non-tender. The abdomen is protuberant and there is a fluid wave present. Bowel sounds positive, normal pitch and   frequency. There is some induration to the abdominal wall near the umbilicus.  Extremities: Full ROM. No  joint swelling or tenderness. Trace edema of the lower legs. Skin: Warm and dry. No rashes. Psych: Alert and oriented. Normal mood and affect.  Health Maintenance Due  Topic Date Due   HIV Screening  Never done   Pneumococcal Vaccine: 50+ Years (2 of 2 - PCV) 10/01/2013   Hepatitis B Vaccines 19-59 Average Risk (3 of 3 - 19+ 3-dose series) 10/19/2014   Colonoscopy  Never done   Zoster Vaccines- Shingrix (2 of 2) 03/22/2023   Lab Results:    Latest Ref Rng & Units 01/26/2024    2:04 PM 06/18/2016   11:25 AM 06/18/2016   11:14 AM  CBC  WBC 4.0 - 10.5 K/uL 4.4   3.9   Hemoglobin 12.0 - 15.0 g/dL 86.7  87.0  86.7   Hematocrit 36.0 - 46.0 % 37.9  38.0  39.2   Platelets 150 - 400 K/uL 84   90       Latest Ref Rng & Units 01/26/2024    2:04 PM 12/17/2022    4:32 PM 06/18/2016   11:25 AM  CMP  Glucose 70 - 99 mg/dL 896  896  882   BUN 6 - 20 mg/dL 5  7  11    Creatinine 0.44 - 1.00 mg/dL 9.54  9.50  9.09   Sodium 135 - 145 mmol/L 133  133  141   Potassium 3.5 - 5.1 mmol/L 3.6  4.0  4.1   Chloride 98 - 111 mmol/L 96  94  104   CO2 22 - 32 mmol/L 25  25    Calcium 8.9 - 10.3 mg/dL 9.4  9.3    Total Protein 6.5 - 8.1 g/dL 7.5  7.5    Total Bilirubin 0.0 - 1.2 mg/dL 4.4  1.5    Alkaline Phos 38 - 126 U/L 132  88    AST 15 - 41 U/L 119  34    ALT 0 - 44 U/L 29  15     Lipase     Component Value Date/Time   LIPASE 18 01/26/2024 1404   Imaging: CT Abdomen and Pelvis w contrast (01/26/2024)  IMPRESSION: 1. Coarse contour of the liver, consistent with cirrhosis. 2. Numerous hypodense nodular lesions throughout the liver, particularly in the right lobe. These are incompletely characterized on this single phase examination, and may reflect regenerative or dysplastic nodules, however malignancy or metastatic disease is not excluded. Consider multiphasic contrast enhanced MRI to further evaluate on a nonemergent, outpatient basis, preferably following paracentesis. 3. Stigmata of portal  hypertension including large volume ascites, gastroesophageal varices, and recanalization of the umbilical vein with caput varices. 4. Diffuse wall thickening of the cecum and ascending colon generally most consistent with portal colopathy. No bowel distention or other evidence of obstruction. 5. Diffusely atrophic, calcified pancreas with diffuse pancreatic ductal dilatation, consistent with chronic pancreatitis.    Assessment & Plan:   1. Alcoholic cirrhosis of liver with ascites (HCC) (Primary) 2. Alcohol-induced chronic pancreatitis (HCC) Review of Ms. Maxon's record shows  prior alcohol levels in 2018 of 365 mg/dl. She also has CT scans and ultrasound showing hepatic steatosis as far back as 2014. I do suspect the primary etiology of her cirrhosis is alcohol use. She is scheduled to see GI in later Nov. We discussed what she can anticipate as part of her evaluation. She appears to have significant portal hypertension resulting in ascites, esophageal varices and abdominal varices. We discussed the risk for GI bleed and SBP.  I strongly advised her to avoid all hepatotoxins,esp. alcohol. She noes she previously had testing for HAV, HBV, and HCV, which were suggestive of prior immunization for HAV and HBV. These were performed by her dermatologist prior to starting her on Tremfya.  I will repeat labs today and check her HCV. Cautioned her to monitor for fever, abdominal pain, hematemesis, and melana.  - CBC - Comprehensive metabolic panel with GFR - Lipase - Protime-INR; Future - HCV Ab w Reflex to Quant PCR   Return in about 2 months (around 04/05/2024) for Reassessment.   Garnette CHRISTELLA Simpler, MD

## 2024-02-05 ENCOUNTER — Ambulatory Visit: Payer: Self-pay | Admitting: Family Medicine

## 2024-02-05 LAB — CBC
HCT: 38.6 % (ref 36.0–46.0)
Hemoglobin: 13.1 g/dL (ref 12.0–15.0)
MCHC: 34 g/dL (ref 30.0–36.0)
MCV: 114.9 fl — ABNORMAL HIGH (ref 78.0–100.0)
Platelets: 140 K/uL — ABNORMAL LOW (ref 150.0–400.0)
RBC: 3.36 Mil/uL — ABNORMAL LOW (ref 3.87–5.11)
RDW: 13.8 % (ref 11.5–15.5)
WBC: 4.9 K/uL (ref 4.0–10.5)

## 2024-02-05 LAB — LIPASE: Lipase: 9 U/L — ABNORMAL LOW (ref 11.0–59.0)

## 2024-02-05 LAB — COMPREHENSIVE METABOLIC PANEL WITH GFR
ALT: 26 U/L (ref 0–35)
AST: 81 U/L — ABNORMAL HIGH (ref 0–37)
Albumin: 3.6 g/dL (ref 3.5–5.2)
Alkaline Phosphatase: 75 U/L (ref 39–117)
BUN: 7 mg/dL (ref 6–23)
CO2: 27 meq/L (ref 19–32)
Calcium: 8.8 mg/dL (ref 8.4–10.5)
Chloride: 95 meq/L — ABNORMAL LOW (ref 96–112)
Creatinine, Ser: 0.38 mg/dL — ABNORMAL LOW (ref 0.40–1.20)
GFR: 115.51 mL/min (ref >60.00)
Glucose, Bld: 111 mg/dL — ABNORMAL HIGH (ref 70–99)
Potassium: 3.5 meq/L (ref 3.5–5.1)
Sodium: 130 meq/L — ABNORMAL LOW (ref 135–145)
Total Bilirubin: 3 mg/dL — ABNORMAL HIGH (ref 0.2–1.2)
Total Protein: 7.3 g/dL (ref 6.0–8.3)

## 2024-02-05 LAB — HCV INTERPRETATION

## 2024-02-05 LAB — HCV AB W REFLEX TO QUANT PCR: HCV Ab: NONREACTIVE

## 2024-03-13 ENCOUNTER — Ambulatory Visit: Admitting: Physician Assistant

## 2024-03-13 ENCOUNTER — Other Ambulatory Visit (INDEPENDENT_AMBULATORY_CARE_PROVIDER_SITE_OTHER)

## 2024-03-13 ENCOUNTER — Encounter: Payer: Self-pay | Admitting: Physician Assistant

## 2024-03-13 VITALS — BP 134/82 | HR 71 | Ht 67.0 in | Wt 177.0 lb

## 2024-03-13 DIAGNOSIS — F1090 Alcohol use, unspecified, uncomplicated: Secondary | ICD-10-CM | POA: Diagnosis not present

## 2024-03-13 DIAGNOSIS — D649 Anemia, unspecified: Secondary | ICD-10-CM | POA: Diagnosis not present

## 2024-03-13 DIAGNOSIS — D696 Thrombocytopenia, unspecified: Secondary | ICD-10-CM

## 2024-03-13 DIAGNOSIS — K860 Alcohol-induced chronic pancreatitis: Secondary | ICD-10-CM

## 2024-03-13 DIAGNOSIS — K7689 Other specified diseases of liver: Secondary | ICD-10-CM

## 2024-03-13 DIAGNOSIS — K7031 Alcoholic cirrhosis of liver with ascites: Secondary | ICD-10-CM | POA: Diagnosis not present

## 2024-03-13 DIAGNOSIS — I341 Nonrheumatic mitral (valve) prolapse: Secondary | ICD-10-CM | POA: Diagnosis not present

## 2024-03-13 DIAGNOSIS — Z1159 Encounter for screening for other viral diseases: Secondary | ICD-10-CM

## 2024-03-13 DIAGNOSIS — K766 Portal hypertension: Secondary | ICD-10-CM

## 2024-03-13 LAB — IBC + FERRITIN
Ferritin: 244.5 ng/mL (ref 10.0–291.0)
Iron: 132 ug/dL (ref 42–145)
Saturation Ratios: 36.4 % (ref 20.0–50.0)
TIBC: 362.6 ug/dL (ref 250.0–450.0)
Transferrin: 259 mg/dL (ref 212.0–360.0)

## 2024-03-13 LAB — CBC WITH DIFFERENTIAL/PLATELET
Basophils Absolute: 0 K/uL (ref 0.0–0.1)
Basophils Relative: 0.9 % (ref 0.0–3.0)
Eosinophils Absolute: 0.2 K/uL (ref 0.0–0.7)
Eosinophils Relative: 4.2 % (ref 0.0–5.0)
HCT: 36.1 % (ref 36.0–46.0)
Hemoglobin: 12.7 g/dL (ref 12.0–15.0)
Lymphocytes Relative: 27 % (ref 12.0–46.0)
Lymphs Abs: 1.1 K/uL (ref 0.7–4.0)
MCHC: 35.2 g/dL (ref 30.0–36.0)
MCV: 106.1 fl — ABNORMAL HIGH (ref 78.0–100.0)
Monocytes Absolute: 0.5 K/uL (ref 0.1–1.0)
Monocytes Relative: 11.7 % (ref 3.0–12.0)
Neutro Abs: 2.3 K/uL (ref 1.4–7.7)
Neutrophils Relative %: 56.2 % (ref 43.0–77.0)
Platelets: 86 K/uL — ABNORMAL LOW (ref 150.0–400.0)
RBC: 3.41 Mil/uL — ABNORMAL LOW (ref 3.87–5.11)
RDW: 12 % (ref 11.5–15.5)
WBC: 4.1 K/uL (ref 4.0–10.5)

## 2024-03-13 LAB — BASIC METABOLIC PANEL WITH GFR
BUN: 8 mg/dL (ref 6–23)
CO2: 27 meq/L (ref 19–32)
Calcium: 9.4 mg/dL (ref 8.4–10.5)
Chloride: 101 meq/L (ref 96–112)
Creatinine, Ser: 0.41 mg/dL (ref 0.40–1.20)
GFR: 113.33 mL/min (ref >60.00)
Glucose, Bld: 96 mg/dL (ref 70–99)
Potassium: 3.6 meq/L (ref 3.5–5.1)
Sodium: 137 meq/L (ref 135–145)

## 2024-03-13 LAB — PROTIME-INR
INR: 1.5 ratio — ABNORMAL HIGH (ref 0.8–1.0)
Prothrombin Time: 15.6 s — ABNORMAL HIGH (ref 9.6–13.1)

## 2024-03-13 LAB — HEPATIC FUNCTION PANEL
ALT: 26 U/L (ref 0–35)
AST: 44 U/L — ABNORMAL HIGH (ref 0–37)
Albumin: 4.2 g/dL (ref 3.5–5.2)
Alkaline Phosphatase: 62 U/L (ref 39–117)
Bilirubin, Direct: 0.7 mg/dL — ABNORMAL HIGH (ref 0.0–0.3)
Total Bilirubin: 2.5 mg/dL — ABNORMAL HIGH (ref 0.2–1.2)
Total Protein: 7.8 g/dL (ref 6.0–8.3)

## 2024-03-13 LAB — VITAMIN B12: Vitamin B-12: 627 pg/mL (ref 211–911)

## 2024-03-13 LAB — AMMONIA: Ammonia: 30 umol/L (ref 11–35)

## 2024-03-13 LAB — FOLATE: Folate: >23.7 ng/mL (ref >5.9)

## 2024-03-13 LAB — GAMMA GT: GGT: 110 U/L — ABNORMAL HIGH (ref 7–51)

## 2024-03-13 NOTE — Patient Instructions (Addendum)
 Your provider has requested that you go to the basement level for lab work before leaving today. Press B on the elevator. The lab is located at the first door on the left as you exit the elevator.   Due to recent changes in healthcare laws, you may see the results of your imaging and laboratory studies on MyChart before your provider has had a chance to review them.  We understand that in some cases there may be results that are confusing or concerning to you. Not all laboratory results come back in the same time frame and the provider may be waiting for multiple results in order to interpret others.  Please give us  48 hours in order for your provider to thoroughly review all the results before contacting the office for clarification of your results.   You have been scheduled for an abdominal paracentesis at [facility] radiology (1st floor of hospital) on Tuesday, 03/24/24 at 9:30 am. Please arrive at least 30 minutes prior to your appointment time for registration. Should you need to reschedule this appointment for any reason, please call our office at [phone].  You have been scheduled for an MRI at [facility] radiology  on Thursday, 03/26/24. Your appointment time is 8:00 am. Please arrive to admitting (at main entrance of the hospital) 30 minutes prior to your appointment time for registration purposes. Please make certain not to have anything to eat or drink 6 hours prior to your test. In addition, if you have any metal in your body, have a pacemaker or defibrillator, please be sure to let your ordering physician know. This test typically takes 45 minutes to 1 hour to complete. Should you need to reschedule, please call [phone] to do so.  You have been scheduled for an endoscopy and colonoscopy. Please follow the written instructions given to you at your visit today.  If you use inhalers (even only as needed), please bring them with you on the day of your procedure.  DO NOT TAKE 7 DAYS  PRIOR TO TEST- Trulicity (dulaglutide) Ozempic, Wegovy (semaglutide) Mounjaro, Zepbound (tirzepatide) Bydureon Bcise (exanatide extended release)  DO NOT TAKE 1 DAY PRIOR TO YOUR TEST Rybelsus (semaglutide) Adlyxin (lixisenatide) Victoza (liraglutide) Byetta (exanatide) ___________________________________________________________________________    TYLENOL  (ACETAMINOPHEN ) IS SAFE IN LIVER DISEASE: You can take tylenol  (acetaminophen ) up to 2,000 mg/day. This would be four extra strength (500mg ) tablets over 24 hours OR six regular strength (325 mg) tablets over 24 hours. Please be sure to read the ingredients of over the counter medications and prescription pain medications as many contain acetaminophen .   NO NSAIDS (ibuprofen, advil, naproxen, aleve, motrin...)  HEPATIC ENCEPHALOPATHY HEPATIC ENCEPHALOPATHY: Confusion caused by a build up of toxins in the blood due to the liver not being able to filter toxins. This can cause confusion mild or severe, increase falls. If you have been diagnosed with Hepatic encephalopathy, advised to not drive due to increased risk   LACTULOSE:  helps pull ammonia and other toxins from the blood into your stool when you have a bowel movement NO NEED TO CHECK AMMONIA LEVEL IN BLOOD! Only need to check if you are having symptoms. 30-5ml up to four times a day Take a dose in the morning If by lunch time you have not had AT LEAST 2 bowel movements take another dose If by dinner you have not had AT LEAST 2 bowel movements that day take another dose If by bedtime you still have not had 2 bowel movements take another dose  Goal of 3-4 bowel movements per day Avoid taking with food as this will cause more gas  IF YOU ARE VERY SLEEPY, HARD FOR YOUR FAMILY TO WAKE YOU UP, OR FALLING ASLEEP DURING CONVERSATIONS -INCREASE LACTULOSE/GO TO THE EMERGENCY ROOM  XIFAXAN (rifaximin) An antibiotic that helps limit excess bacteria in the intestine.  The bacteria  can cause increased ammonia One 550mg  tablet twice daily  SUPPLEMENTS: multivitamin Zinc Branch Chain Amino acids (BCAA): 12 grams/day L-Ornithine L-Aspartate (LOLA): 6 grams three times/day ([email or web address]) L-Carnitine  PHYSICAL ACTIVITY It is important to continue to be active when you have cirrhosis. Exercise will help reduce muscle loss and weakness.  DISCUSS REFERRAL FOR PHYSICAL THERAPY WITH YOUR PRIMARY CARE PROVIDER  DIET/NUTRITION FOR CIRRHOSIS NO ALCOHOL YOUR GOALS Evening snack - high protein Supplements between meals to help meet calorie and protein goal: Boost Ensure Premier Protein Shakes Protein Greek yogurt Fish, chicken (NO RAW OR UNDERCOOKED FISH/SHELLFISH) Avoid pork and red meat Plant based protein (non-soy)/Vegan: Lentils, Chickpeas, Peanuts (non salted), almonds (non salted), quinoa, chia seeds  Plant based protein supplements (not soy)  Avoid/limit animal based protein supplements: whey, casein 4.  Low sodium (2,000 mg/day) A. Avoid: table salt, canned foods, deli meats, sausages, hot dogs, anything with a long shelf life B. Read nutrition labels and be aware of serving size. Don't go by percent of daily value.    Diet Instructions for Patients with Cirrhosis Eating well is very important for people with cirrhosis. Good nutrition can help you feel better, keep your muscles strong, and lower your risk of complications.[1-3] Eat enough calories and protein: Aim for about 35 calories per kilogram of your body weight each day, and 1.2-1.5 grams of protein per kilogram. Protein helps keep your muscles strong. Do not avoid protein, even if you have had confusion or hepatic encephalopathy in the past.[1-2][4-6] Choose a variety of protein sources: Include lean meats, fish, eggs, dairy, beans, nuts, and lentils. Vegetable and dairy proteins are especially helpful.[1][7]Branched-chain amino acid-rich foods (e.g., dairy, legumes, fish) may be  particularly beneficial. Eat small, frequent meals: Try to eat every 3-4 hours while awake. Have a snack before bed, such as yogurt, cheese, or a sandwich, to help prevent muscle loss overnight.[1-3][5] Practical options include yogurt, protein bars, rice balls, nuts, beans, or fish, and should be tailored to patient preferences and local food availability.[1][6-8]  Limit salt (sodium) if you have swelling or fluid in your belly (ascites): Use less than 2,000 mg of sodium per day. Avoid salty foods like canned soups, processed meats, chips, and restaurant meals. Read food labels for sodium content.[2] Drink fluids as usual, unless your doctor tells you to restrict fluids. Only restrict fluids if your sodium level in the blood is very low.[2] Take vitamin and mineral supplements if recommended: People with cirrhosis often need extra vitamins and minerals, especially zinc, vitamin D , and thiamine. Your doctor may check your levels and suggest supplements.[6][8-9] Avoid alcohol completely: Alcohol can make liver damage worse.[8] Stay active: Gentle exercise, like walking or light resistance training, can help keep your muscles strong.[4][7-8] See a dietitian if possible: A nutrition expert can help you make a meal plan that fits your needs.[1] If you have trouble eating enough or are losing weight, tell your healthcare team. They may suggest nutrition drinks or other ways to help you get the calories and protein you need.[1-3] Remember, eating well is a key part of managing cirrhosis and staying healthy.  Thank you for trusting me with your gastrointestinal  care!   Alan Coombs, PA-C  _______________________________________________________  If your blood pressure at your visit was 140/90 or greater, please contact your primary care physician to follow up on this.  _______________________________________________________  If you are age 78 or older, your body mass index should be between 23-30.  Your Body mass index is 27.72 kg/m. If this is out of the aforementioned range listed, please consider follow up with your Primary Care Provider.  If you are age 29 or younger, your body mass index should be between 19-25. Your Body mass index is 27.72 kg/m. If this is out of the aformentioned range listed, please consider follow up with your Primary Care Provider.   ________________________________________________________  The [facility] providers would like to encourage you to use MYCHART to communicate with providers for non-urgent requests or questions.  Due to long hold times on the telephone, sending your provider a message by [facility] may be a faster and more efficient way to get a response.  Please allow 48 business hours for a response.  Please remember that this is for non-urgent requests.  _______________________________________________________  [facility] is using a team-based approach to care.  Your team is made up of your doctor and two to three APPS. Our APPS (Nurse Practitioners and Physician Assistants) work with your physician to ensure care continuity for you. They are fully qualified to address your health concerns and develop a treatment plan. They communicate directly with your gastroenterologist to care for you. Seeing the Advanced Practice Practitioners on your physician's team can help you by facilitating care more promptly, often allowing for earlier appointments, access to diagnostic testing, procedures, and other specialty referrals.

## 2024-03-13 NOTE — Progress Notes (Signed)
 03/13/2024 Sara Collins [id number] 1970/11/28  Referring provider: Thedora Garnette HERO, MD Primary GI doctor: Dr. 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, portal colopathy, pancreatic atrophy, liver lesions 01/26/2024 CTAP W coarse contour liver consistent with cirrhosis numerous hypodense nodular lesions throughout the liver and right lobe, stigmata portal hypertension including large volume ascites gastroesophageal varices recanalization of umbilical vein with caput varices, diffuse wall thickening cecum and ascending colon most consistent with portal colopathy diffusely atrophic calcified pancreas with diffuse pancreatic duct dilation consistent with chronic pancreatitis No serological workup Hepatitis C negative 02/04/2024 WBC 4.9 HGB 13.1 Platelets 140.0 02/04/2024 AST 81 ALT 26 Alkphos 75 TBili 3.0 No recent INR, will get to calculate MELD as well as evaluate synthetic function of liver. -Continue daily multivitamin -Recommended 30 minutes of aerobic and resistance exercise 3 days/week -Encouraged pt to increase protein intake  Serologic workup: Will get this visit if negative most likely MASH/alcohol  Ascites:      Ascites on exam.  -Will schedule for diagnostic and therapeutic paracentesis. -Will start furosemide 40 mg daily along with spironolactone 100 mg daily pending kidney function -No max limit to be removed. If they take off 4-5 L please give IV 25 grams albumin x 1  -Please send for cell count with differential, albumin concentration, total protein concentration. -Nutrition and low sodium diet discussed with patient and information given Declines lasix/spironolactone, has lost some weight with diet/exercise, will get paracentesis and if has accumulation can start on lasix 20 and spironolactone 50mg   Varices screening / surveillance EGD:    Portal  hypertension seen on recent CT, patient needs EGD for variceal screening. I discussed risks of EGD with patient today, including risk of sedation, bleeding or perforation.  Patient provides understanding and gave verbal consent to proceed. Consider prophylaxis pending results  Hepatic encephalopathy:  Pt does not report any symptoms consistent with HE but does have some asterixis on exam.  Check ammonia  Most recent HCC screening:    CTAP W in October shows liver lesions. Will get AFP and MRI with Eovist to evaluate further AFTER paracentesis  Chronic pancreatitis with ductal dilation Will evaluate further with MRI abdomen pelvis with Eovist Likely from alcohol Advised to stop drinking, states she has stopped  Alcohol Use -Alcohol Abstinence counseling discussed with patient as continued use is strongly associated with worsening liver disease progression - get on MVIT - discuss with PCP about resources and medications, discussed briefly with patient  Screening colonoscopy Never done No family history of colon cancer No changes in Bm's, hematochezia Schedule for colonoscopy, We have discussed the risks of bleeding, infection, perforation, medication reactions, and remote risk of death associated with colonoscopy. All questions were answered and the patient acknowledges these risk and wishes to proceed.  Psoriasis Was on Tremfya, discontinued Can restart tremfya  Patient Care Team: Thedora Garnette HERO, MD as PCP - General (Family Medicine) Court Pulling, MD as Referring Physician (Dermatology)  HISTORY OF PRESENT ILLNESS: 53 y.o. female with medical history  Discussed the use of AI scribe software for clinical note transcription with the patient, who gave verbal consent to proceed.  History of Present Illness   Sara Collins is a 53 year old female with cirrhosis who presents with abdominal fluid retention and breathing difficulties.  Approximately six to seven weeks  ago, she experienced a rapid onset of fluid gain, leading to difficulty breathing  and eating. A CT scan was performed in October. Despite dietary changes, including eliminating sodium and alcohol, she continues to experience abdominal pressure, particularly affecting her ability to breathe and eat comfortably.  Her current diet consists of small, nutrient-dense meals, including protein shakes, blueberries, and Lean Cuisine Power Bowls. She avoids adding sodium and prefers plain foods without preservatives. She reports normal bowel movements without discoloration or pain.  She has started exercising at [employer], initially walking a mile on the treadmill and now doing a mile on the elliptical, keeping her heart rate between 100 and 110 to avoid breathing difficulties. She experiences slight headaches post-exercise but no chest pain or discomfort during physical activity.  She has a history of a heart murmur diagnosed at birth, specifically an arterial valve prolapse, but does not take medication for it. Her breathing becomes labored with prolonged talking, but she denies any chest discomfort or coughing.  She has a history of psoriasis, previously covering 95% of her body, for which she was on Tremfya for three years. She has not taken Tremfya since her recent hospitalization. Currently, she uses a steroid cream for residual psoriasis around her sinuses. She reports hair shedding, which she attributes to potential hormonal changes or liver function issues, as she has been post-menopausal for ten years.  She has a history of alcohol use, with her last consumption occurring eight weeks ago, prior to her hospitalization. She has a history of fertility treatments and severe iron deficiency during her pregnancies, requiring prescription-level iron supplementation. No family history of colon cancer, pancreatic cancer, cirrhosis, or liver disease.      Social history:  She  reports that she quit smoking  about 34 years ago. Her smoking use included cigarettes. She has a 0.5 pack-year smoking history. She has never used smokeless tobacco. She reports current alcohol use of about 6.0 standard drinks of alcohol per week. She reports that she does not use drugs.  RELEVANT GI HISTORY, LABS, IMAGING: 01/26/2024 CTAP W 1. Coarse contour of the liver, consistent with cirrhosis. 2. Numerous hypodense nodular lesions throughout the liver, particularly in the right lobe. These are incompletely characterized on this single phase examination, and may reflect regenerative or dysplastic nodules, however malignancy or metastatic disease is not excluded. Consider multiphasic contrast enhanced MRI to further evaluate on a nonemergent, outpatient basis, preferably following paracentesis. 3. Stigmata of portal hypertension including large volume ascites, gastroesophageal varices, and recanalization of the umbilical vein with caput varices. 4. Diffuse wall thickening of the cecum and ascending colon generally most consistent with portal colopathy. No bowel distention or other evidence of obstruction. 5. Diffusely atrophic, calcified pancreas with diffuse pancreatic ductal dilatation, consistent with chronic pancreatitis. CBC    Component Value Date/Time   WBC 4.9 02/04/2024 1625   RBC 3.36 (L) 02/04/2024 1625   HGB 13.1 02/04/2024 1625   HCT 38.6 02/04/2024 1625   PLT 140.0 (L) 02/04/2024 1625   MCV 114.9 (H) 02/04/2024 1625   MCH 39.3 (H) 01/26/2024 1404   MCHC 34.0 02/04/2024 1625   RDW 13.8 02/04/2024 1625   LYMPHSABS 1.5 06/18/2016 1114   MONOABS 0.4 06/18/2016 1114   EOSABS 0.2 06/18/2016 1114   BASOSABS 0.1 06/18/2016 1114   Recent Labs    01/26/24 1404 02/04/24 1625  HGB 13.2 13.1    CMP     Component Value Date/Time   NA 130 (L) 02/04/2024 1625   K 3.5 02/04/2024 1625   CL 95 (L) 02/04/2024 1625   CO2  27 02/04/2024 1625   GLUCOSE 111 (H) 02/04/2024 1625   BUN 7 02/04/2024 1625    CREATININE 0.38 (L) 02/04/2024 1625   CALCIUM 8.8 02/04/2024 1625   PROT 7.3 02/04/2024 1625   ALBUMIN 3.6 02/04/2024 1625   AST 81 (H) 02/04/2024 1625   ALT 26 02/04/2024 1625   ALKPHOS 75 02/04/2024 1625   BILITOT 3.0 (H) 02/04/2024 1625   GFRNONAA >60 01/26/2024 1404   GFRAA >60 06/18/2016 1114      Latest Ref Rng & Units 02/04/2024    4:25 PM 01/26/2024    2:04 PM 12/17/2022    4:32 PM  Hepatic Function  Total Protein 6.0 - 8.3 g/dL 7.3  7.5  7.5   Albumin 3.5 - 5.2 g/dL 3.6  3.6  4.1   AST 0 - 37 U/L 81  119  34   ALT 0 - 35 U/L 26  29  15    Alk Phosphatase 39 - 117 U/L 75  132  88   Total Bilirubin 0.2 - 1.2 mg/dL 3.0  4.4  1.5       Current Medications:        Current Outpatient Medications (Other):    Guselkumab (TREMFYA [place]), Inject 100 mg into the skin every 8 (eight) weeks. (Patient taking differently: Inject 100 mg into the skin every 8 (eight) weeks. On hold for now)   Multiple Vitamin (MULTIVITAMINS PO), Orally  Medical History:  Past Medical History:  Diagnosis Date   Allergy Sulfa based   Heart murmur Born with a murmur   No medication   Mitral valve prolapse    Pinched nerve    Sciatica    TIA (transient ischemic attack)    Allergies:  Allergies  Allergen Reactions   Sulfa Antibiotics Anaphylaxis     Surgical History:  She  has a past surgical history that includes Laparoscopy with co2 laser and Hysterosalpingogram. Family History:  Her family history includes Heart disease in her father; Hyperlipidemia in her mother; Stroke in her father.  REVIEW OF SYSTEMS  : All other systems reviewed and negative except where noted in the History of Present Illness.  PHYSICAL EXAM: BP 134/82   Pulse 71   Ht 5' 7 (1.702 m)   Wt 177 lb (80.3 kg)   LMP 11/15/2011   SpO2 99%   BMI 27.72 kg/m  Physical Exam   MEASUREMENTS: Weight- 177. GENERAL APPEARANCE: Well nourished, in no apparent distress. HEENT: No cervical lymphadenopathy, unremarkable  thyroid , sclerae anicteric, conjunctiva pink. RESPIRATORY: Respiratory effort normal, breath sounds equal bilaterally without rales, rhonchi, or wheezing. CARDIO: Regular rhythm with occasional extra beat, no murmurs, rubs, or gallops, peripheral pulses intact. ABDOMEN: Soft, non-distended, active bowel sounds in all four quadrants, no tenderness to palpation, no rebound, no mass appreciated, shifting dullness indicating fluid presence. RECTAL: Declines. MUSCULOSKELETAL: Full range of motion, normal gait, without edema. SKIN: Dry, intact without rashes or lesions. No jaundice. NEURO: Alert, oriented, no focal deficits. PSYCH: Cooperative, normal mood and affect.         Alan JONELLE Coombs, PA-C 3:06 PM

## 2024-03-16 ENCOUNTER — Ambulatory Visit: Payer: Self-pay | Admitting: Physician Assistant

## 2024-03-16 MED ORDER — GUSELKUMAB 100 MG/ML SUBCUTANEOUS AUTO-INJECTOR (ONE PRESS)
SUBCUTANEOUS | 5 refills | 56.00000 days | Status: CN
Start: 2024-03-16 — End: ?

## 2024-03-16 NOTE — Progress Notes (Signed)
 [facility] Refill Coordination Note    Specialty Medication(s) to be Shipped:   Inflammatory Disorders: Tremfya     Other medication(s) to be shipped: No additional medications requested for fill at this time    Specialty Medications not needed at this time: N/A     Nina Vargas, DOB: November 17, 1970  Phone: There are no phone numbers on file.      All above HIPAA information was verified with patient.     Was a nurse, learning disability used for this call? No    Completed refill call assessment today to schedule patient's medication shipment from the [facility]  [phone]).  All relevant notes have been reviewed.     Specialty medication(s) and dose(s) confirmed: Regimen is correct and unchanged.   Changes to medications: Amberlie reports no changes at this time.  Changes to insurance: No  New side effects reported not previously addressed with a pharmacist or physician: None reported  Questions for the pharmacist: No    Confirmed patient received a Conservation Officer, Historic Buildings and a Surveyor, Mining with first shipment. The patient will receive a drug information handout for each medication shipped and additional FDA Medication Guides as required.       DISEASE/MEDICATION-SPECIFIC INFORMATION        For patients on injectable medications: Next injection is scheduled for 12/02.    SPECIALTY MEDICATION ADHERENCE     Medication Adherence    Patient reported X missed doses in the last month: 0  Specialty Medication: TREMFYA  100 mg/mL [name] (guselkumab )  Patient is on additional specialty medications: No              Were doses missed due to medication being on hold? No    TREMFYA  100 mg/mL [name] (guselkumab ): 0 doses of medicine on hand       REFERRAL TO PHARMACIST     Referral to the pharmacist: Not needed      [facility]     Shipping address confirmed in Epic.     Cost and Payment: Patient has a $0 copay, payment information is not required.    Delivery Scheduled: Yes, Expected medication delivery date: 11/26.     Medication will be delivered via UPS to the prescription address in Epic WAM.    Nina Vargas   [place] [facility]  Specialty Technician

## 2024-03-17 LAB — TISSUE TRANSGLUTAMINASE, IGA: (tTG) Ab, IgA: <1 U/mL

## 2024-03-17 LAB — AFP TUMOR MARKER: AFP-Tumor Marker: 5.9 ng/mL

## 2024-03-17 LAB — VITAMIN B1: Vitamin B1 (Thiamine): 6 nmol/L — ABNORMAL LOW (ref 8–30)

## 2024-03-17 LAB — MITOCHONDRIAL ANTIBODIES: Mitochondrial M2 Ab, IgG: <=20 U (ref <=20.0)

## 2024-03-17 LAB — ALPHA-1-ANTITRYPSIN: A-1 Antitrypsin, Ser: 207 mg/dL — ABNORMAL HIGH (ref 83–199)

## 2024-03-17 LAB — IGG: IgG (Immunoglobin G), Serum: 1841 mg/dL — ABNORMAL HIGH (ref 600–1640)

## 2024-03-17 LAB — HEPATITIS B SURFACE ANTIGEN: Hepatitis B Surface Ag: NONREACTIVE

## 2024-03-17 LAB — CERULOPLASMIN: Ceruloplasmin: 29 mg/dL (ref 14–48)

## 2024-03-17 LAB — ANA: Anti Nuclear Antibody (ANA): NEGATIVE

## 2024-03-17 LAB — IGA: Immunoglobulin A: 560 mg/dL — ABNORMAL HIGH (ref 47–310)

## 2024-03-17 LAB — ANTI-SMOOTH MUSCLE ANTIBODY, IGG: Actin (Smooth Muscle) Antibody (IGG): <20 U (ref <20)

## 2024-03-17 MED FILL — TREMFYA ONE-PRESS 100 MG/ML SUBCUTANEOUS AUTO-INJECTOR: SUBCUTANEOUS | 56 days supply | Qty: 1 | Fill #0

## 2024-03-24 ENCOUNTER — Ambulatory Visit (HOSPITAL_COMMUNITY)
Admission: RE | Admit: 2024-03-24 | Discharge: 2024-03-24 | Disposition: A | Source: Ambulatory Visit | Attending: Physician Assistant | Admitting: Physician Assistant

## 2024-03-24 ENCOUNTER — Other Ambulatory Visit: Payer: Self-pay | Admitting: Physician Assistant

## 2024-03-24 DIAGNOSIS — K7031 Alcoholic cirrhosis of liver with ascites: Secondary | ICD-10-CM

## 2024-03-24 DIAGNOSIS — K766 Portal hypertension: Secondary | ICD-10-CM

## 2024-03-24 DIAGNOSIS — D696 Thrombocytopenia, unspecified: Secondary | ICD-10-CM

## 2024-03-24 DIAGNOSIS — F1090 Alcohol use, unspecified, uncomplicated: Secondary | ICD-10-CM

## 2024-03-24 DIAGNOSIS — D649 Anemia, unspecified: Secondary | ICD-10-CM

## 2024-03-24 DIAGNOSIS — K7689 Other specified diseases of liver: Secondary | ICD-10-CM

## 2024-03-24 DIAGNOSIS — K860 Alcohol-induced chronic pancreatitis: Secondary | ICD-10-CM

## 2024-03-24 DIAGNOSIS — I10 Essential (primary) hypertension: Secondary | ICD-10-CM | POA: Diagnosis not present

## 2024-03-24 DIAGNOSIS — Z1159 Encounter for screening for other viral diseases: Secondary | ICD-10-CM

## 2024-03-24 DIAGNOSIS — I341 Nonrheumatic mitral (valve) prolapse: Secondary | ICD-10-CM

## 2024-03-24 MED ORDER — LIDOCAINE-EPINEPHRINE 1 %-1:100000 IJ SOLN
INTRAMUSCULAR | Status: AC
  Filled 2024-03-24: qty 1

## 2024-03-24 NOTE — Progress Notes (Signed)
 Patient ID: Sara Collins, female   DOB: 1970-06-23, 53 y.o.   MRN: [id number] Patient presented to IR department today for paracentesis.  On limited ultrasound of abdomen in all 4 quadrants there is no significant free peritoneal fluid.  Paracentesis canceled.  Patient updated.

## 2024-03-26 ENCOUNTER — Ambulatory Visit (HOSPITAL_COMMUNITY)

## 2024-03-28 ENCOUNTER — Ambulatory Visit (HOSPITAL_COMMUNITY)
Admission: RE | Admit: 2024-03-28 | Discharge: 2024-03-28 | Disposition: A | Source: Ambulatory Visit | Attending: Physician Assistant | Admitting: Physician Assistant

## 2024-03-28 DIAGNOSIS — C220 Liver cell carcinoma: Secondary | ICD-10-CM | POA: Diagnosis not present

## 2024-03-28 DIAGNOSIS — R188 Other ascites: Secondary | ICD-10-CM | POA: Diagnosis not present

## 2024-03-28 DIAGNOSIS — K7031 Alcoholic cirrhosis of liver with ascites: Secondary | ICD-10-CM

## 2024-03-28 DIAGNOSIS — K7689 Other specified diseases of liver: Secondary | ICD-10-CM

## 2024-03-28 DIAGNOSIS — K7460 Unspecified cirrhosis of liver: Secondary | ICD-10-CM | POA: Diagnosis not present

## 2024-03-28 DIAGNOSIS — K766 Portal hypertension: Secondary | ICD-10-CM | POA: Diagnosis not present

## 2024-03-28 MED ORDER — GADOXETATE DISODIUM 0.25 MMOL/ML IV SOLN
8.0000 mL | Freq: Once | INTRAVENOUS | Status: AC | PRN
  Administered 2024-03-28: 8 mL via INTRAVENOUS

## 2024-03-30 MED ORDER — TREMFYA ONE-PRESS 100 MG/ML SUBCUTANEOUS AUTO-INJECTOR
0 refills | 0.00000 days
Start: 2024-03-30 — End: ?

## 2024-04-06 ENCOUNTER — Ambulatory Visit: Admitting: Family Medicine

## 2024-04-28 ENCOUNTER — Encounter: Admitting: Internal Medicine

## 2024-04-30 ENCOUNTER — Telehealth: Payer: Self-pay | Admitting: Family Medicine

## 2024-04-30 NOTE — Telephone Encounter (Signed)
 Copied from CRM [id number]. Topic: General - Deceased Patient >> 28-May-2024 11:39 AM Nessti S wrote: Name of caller: kevin matthews  Date of death: 25-May-2024   Name of funeral home: [facility]  Phone number of funeral home: [phone]  Provider that needs to sign form: stephen rudd  Timeline for signing: 05/04/24

## 2024-05-01 NOTE — Telephone Encounter (Signed)
 Calling again to check on the status of the death certificate being signed.

## 2024-05-02 ENCOUNTER — Encounter: Payer: Self-pay | Admitting: Family Medicine

## 2024-05-02 NOTE — Progress Notes (Unsigned)
 Received a request to complete a death certificate for [name]. I contacted Heron Maus, [name], who provided information about the death.  At her last visit, [name] disclosed having been diagnosed with alcoholic cirrhosis. This was complicated by portal hypertension and pancreatitis. [name] had disclosed a long-standing history of regular alcohol consumption. She had apparently been stable since Oct. She had been advised to have both upper and lower endoscopy, but this had not occurred. On the week prior to her death, she had a flu-like illness. Her mother had spoken with her earlier on 19-May-2024. She had been recovering form her flu. That evening her live-in boyfriend had come home to find her feeling ill at home. Later that evening, Heron received a call from the boyfriend noting that [name] was having difficulty breathing. 911 was called and he attempted resuscitation. EMS arrived and continued this efforts. She reportedly was vomiting blood around the time of her collapse. EMS was advised by an ED physician to stop any further resuscitation at ~ 11:20 pm.  Cause of death: Gastrointestinal hemorrhage, due to Alcoholic cirrhosis, due to  Alcohol Dependence  Manner of Death: Sara Garnette EMERSON Thedora, MD

## 2024-05-04 NOTE — Telephone Encounter (Signed)
 Death certificate was done over the weekend by provider through the portal.  Dm/cma

## 2024-05-13 NOTE — Progress Notes (Signed)
 The [facility] has made a second and final attempt to reach this patient to refill the following medication:TREMFYA  ONE-PRESS 100 mg/mL [name] ONE PRESS (guselkumab ) .      We have left voicemails on the following phone numbers: [phone], have sent a MyChart message, and have sent a text message to the following phone numbers: [phone].    Dates contacted: 05/05/2024, 05/13/2024  Last scheduled delivery: 03/17/2024    The patient may be at risk of non-compliance with this medication. The patient should call the [facility] at [phone]  Option 4, then Option 2: Dermatology, Gastroenterology, Rheumatology to refill medication.     Sara Collins UNK Sara Collins

## 2024-05-13 NOTE — Progress Notes (Signed)
 The [facility] has made a second and final attempt to reach this patient to refill the following medication:TREMFYA  ONE-PRESS 100 mg/mL [name] ONE PRESS (guselkumab ) .      We have left voicemails on the following phone numbers: [phone], have sent a MyChart message, and have sent a text message to the following phone numbers: [phone].    Dates contacted: 05/05/2024, 05/13/2024  Last scheduled delivery: 03/17/2024    The patient may be at risk of non-compliance with this medication. The patient should call the [facility] at [phone]  Option 4, then Option 2: Dermatology, Gastroenterology, Rheumatology to refill medication.     Nina Vargas UNK Nina Vargas Nina Vargas [name]

## 2024-05-24 DEATH — deceased
